# Patient Record
Sex: Male | Born: 1965 | Hispanic: Yes | Marital: Married | Smoking: Never smoker
Health system: Southern US, Community
[De-identification: ages and names within clinical notes are randomized; demographics above are authoritative.]

## PROBLEM LIST (undated history)

## (undated) DIAGNOSIS — K6289 Other specified diseases of anus and rectum: Secondary | ICD-10-CM

## (undated) DIAGNOSIS — M25519 Pain in unspecified shoulder: Secondary | ICD-10-CM

## (undated) DIAGNOSIS — R7303 Prediabetes: Secondary | ICD-10-CM

## (undated) DIAGNOSIS — R51 Headache: Secondary | ICD-10-CM

## (undated) DIAGNOSIS — G8929 Other chronic pain: Secondary | ICD-10-CM

## (undated) DIAGNOSIS — R519 Headache, unspecified: Secondary | ICD-10-CM

## (undated) HISTORY — PX: SHOULDER SURGERY: SHX246

## (undated) HISTORY — DX: Pain in unspecified shoulder: M25.519

## (undated) HISTORY — DX: Other chronic pain: G89.29

## (undated) HISTORY — DX: Headache: R51

## (undated) HISTORY — DX: Headache, unspecified: R51.9

## (undated) HISTORY — DX: Other specified diseases of anus and rectum: K62.89

---

## 2012-11-02 DIAGNOSIS — K602 Anal fissure, unspecified: Secondary | ICD-10-CM | POA: Insufficient documentation

## 2013-02-03 ENCOUNTER — Emergency Department: Payer: Self-pay | Admitting: Emergency Medicine

## 2013-02-10 ENCOUNTER — Emergency Department: Payer: Self-pay | Admitting: Emergency Medicine

## 2013-03-18 ENCOUNTER — Inpatient Hospital Stay: Payer: Self-pay | Admitting: Psychiatry

## 2013-03-18 LAB — DRUG SCREEN, URINE

## 2013-03-18 LAB — COMPREHENSIVE METABOLIC PANEL WITH GFR
Albumin: 4.4 g/dL
Alkaline Phosphatase: 63 U/L
Anion Gap: 5 — ABNORMAL LOW
BUN: 16 mg/dL
Bilirubin,Total: 0.6 mg/dL
Calcium, Total: 9.6 mg/dL
Chloride: 102 mmol/L
Co2: 27 mmol/L
Creatinine: 1.22 mg/dL
EGFR (African American): >60
EGFR (Non-African Amer.): >60
Glucose: 222 mg/dL — ABNORMAL HIGH
Osmolality: 276
Potassium: 4.2 mmol/L
SGOT(AST): 27 U/L
SGPT (ALT): 49 U/L
Sodium: 134 mmol/L — ABNORMAL LOW
Total Protein: 8.8 g/dL — ABNORMAL HIGH

## 2013-03-18 LAB — CBC
HCT: 50.1 %
HGB: 17.3 g/dL
MCH: 29.5 pg
MCHC: 34.5 g/dL
MCV: 86 fL
Platelet: 229 x10 3/mm 3
RBC: 5.86 x10 6/mm 3
RDW: 13 %
WBC: 9.1 x10 3/mm 3

## 2013-03-18 LAB — URINALYSIS, COMPLETE
Glucose,UR: >=500 mg/dL (ref 0–75)
Ketone: NEGATIVE
Leukocyte Esterase: NEGATIVE
Ph: 7 (ref 4.5–8.0)
Protein: NEGATIVE
RBC,UR: 1 /HPF (ref 0–5)
Specific Gravity: 1.017 (ref 1.003–1.030)
WBC UR: NONE SEEN /HPF (ref 0–5)

## 2013-03-18 LAB — SALICYLATE LEVEL: Salicylates, Serum: <1.7 mg/dL

## 2013-03-18 LAB — ETHANOL
Ethanol %: <0.003 %
Ethanol: <3 mg/dL

## 2013-03-18 LAB — ACETAMINOPHEN LEVEL: Acetaminophen: <2 ug/mL

## 2013-03-19 LAB — HEMOGLOBIN A1C: Hemoglobin A1C: 5.5 % (ref 4.2–6.3)

## 2013-03-26 ENCOUNTER — Emergency Department: Payer: Self-pay | Admitting: Emergency Medicine

## 2013-03-26 LAB — COMPREHENSIVE METABOLIC PANEL
Albumin: 3.7 g/dL (ref 3.4–5.0)
BUN: 13 mg/dL (ref 7–18)
Bilirubin,Total: 0.4 mg/dL (ref 0.2–1.0)
Calcium, Total: 8.4 mg/dL — ABNORMAL LOW (ref 8.5–10.1)
Chloride: 106 mmol/L (ref 98–107)
Co2: 26 mmol/L (ref 21–32)
Creatinine: 1.14 mg/dL (ref 0.60–1.30)
EGFR (African American): >60
EGFR (Non-African Amer.): >60
Glucose: 166 mg/dL — ABNORMAL HIGH (ref 65–99)
Potassium: 3.3 mmol/L — ABNORMAL LOW (ref 3.5–5.1)
SGOT(AST): 12 U/L — ABNORMAL LOW (ref 15–37)
Sodium: 139 mmol/L (ref 136–145)
Total Protein: 6.9 g/dL (ref 6.4–8.2)

## 2013-03-26 LAB — T4, FREE: Free Thyroxine: 1.03 ng/dL (ref 0.76–1.46)

## 2013-03-26 LAB — CK TOTAL AND CKMB (NOT AT ARMC)
CK, Total: 44 U/L (ref 35–232)
CK-MB: 0.9 ng/mL (ref 0.5–3.6)

## 2013-03-26 LAB — CBC
MCHC: 34.7 g/dL (ref 32.0–36.0)
Platelet: 164 10*3/uL (ref 150–440)
RBC: 5.4 10*6/uL (ref 4.40–5.90)
RDW: 13.4 % (ref 11.5–14.5)
WBC: 7.6 10*3/uL (ref 3.8–10.6)

## 2013-03-26 LAB — TSH: Thyroid Stimulating Horm: 0.694 u[IU]/mL

## 2013-03-26 LAB — TROPONIN I: Troponin-I: <0.02 ng/mL

## 2013-04-27 ENCOUNTER — Ambulatory Visit: Payer: Self-pay | Admitting: Orthopedic Surgery

## 2013-05-03 ENCOUNTER — Emergency Department: Payer: Self-pay | Admitting: Emergency Medicine

## 2013-05-03 LAB — COMPREHENSIVE METABOLIC PANEL
ALBUMIN: 4.3 g/dL (ref 3.4–5.0)
ALK PHOS: 55 U/L
Anion Gap: 10 (ref 7–16)
BILIRUBIN TOTAL: 1.5 mg/dL — AB (ref 0.2–1.0)
BUN: 14 mg/dL (ref 7–18)
CALCIUM: 9.1 mg/dL (ref 8.5–10.1)
CHLORIDE: 100 mmol/L (ref 98–107)
CO2: 24 mmol/L (ref 21–32)
Creatinine: 1.01 mg/dL (ref 0.60–1.30)
EGFR (African American): >60
Glucose: 163 mg/dL — ABNORMAL HIGH (ref 65–99)
Osmolality: 272 (ref 275–301)
Potassium: 3 mmol/L — ABNORMAL LOW (ref 3.5–5.1)
SGOT(AST): 30 U/L (ref 15–37)
SGPT (ALT): 48 U/L (ref 12–78)
Sodium: 134 mmol/L — ABNORMAL LOW (ref 136–145)
Total Protein: 8.1 g/dL (ref 6.4–8.2)

## 2013-05-03 LAB — URINALYSIS, COMPLETE
BACTERIA: NONE SEEN
Bilirubin,UR: NEGATIVE
Blood: NEGATIVE
Glucose,UR: NEGATIVE mg/dL (ref 0–75)
Ketone: NEGATIVE
Leukocyte Esterase: NEGATIVE
NITRITE: NEGATIVE
PH: 6 (ref 4.5–8.0)
Protein: NEGATIVE
RBC, UR: NONE SEEN /HPF (ref 0–5)
SPECIFIC GRAVITY: 1.001 (ref 1.003–1.030)
SQUAMOUS EPITHELIAL: NONE SEEN
WBC UR: NONE SEEN /HPF (ref 0–5)

## 2013-05-03 LAB — CK TOTAL AND CKMB (NOT AT ARMC)
CK, Total: 202 U/L (ref 35–232)
CK-MB: 1.3 ng/mL (ref 0.5–3.6)

## 2013-05-03 LAB — DRUG SCREEN, URINE
AMPHETAMINES, UR SCREEN: NEGATIVE (ref ?–1000)
BARBITURATES, UR SCREEN: NEGATIVE (ref ?–200)
Benzodiazepine, Ur Scrn: NEGATIVE (ref ?–200)
COCAINE METABOLITE, UR ~~LOC~~: NEGATIVE (ref ?–300)
Cannabinoid 50 Ng, Ur ~~LOC~~: NEGATIVE (ref ?–50)
MDMA (ECSTASY) UR SCREEN: NEGATIVE (ref ?–500)
Methadone, Ur Screen: NEGATIVE (ref ?–300)
Opiate, Ur Screen: NEGATIVE (ref ?–300)
PHENCYCLIDINE (PCP) UR S: NEGATIVE (ref ?–25)
Tricyclic, Ur Screen: NEGATIVE (ref ?–1000)

## 2013-05-03 LAB — CBC
HCT: 49.9 % (ref 40.0–52.0)
HGB: 17.1 g/dL (ref 13.0–18.0)
MCH: 29.7 pg (ref 26.0–34.0)
MCHC: 34.4 g/dL (ref 32.0–36.0)
MCV: 87 fL (ref 80–100)
Platelet: 175 10*3/uL (ref 150–440)
RBC: 5.76 10*6/uL (ref 4.40–5.90)
RDW: 13.6 % (ref 11.5–14.5)
WBC: 7.3 10*3/uL (ref 3.8–10.6)

## 2013-05-03 LAB — ETHANOL

## 2013-05-03 LAB — TROPONIN I: Troponin-I: <0.02 ng/mL

## 2013-05-26 ENCOUNTER — Encounter (HOSPITAL_COMMUNITY): Payer: Self-pay | Admitting: Psychiatry

## 2013-05-26 ENCOUNTER — Ambulatory Visit (INDEPENDENT_AMBULATORY_CARE_PROVIDER_SITE_OTHER): Payer: No Typology Code available for payment source | Admitting: Psychiatry

## 2013-05-26 VITALS — BP 115/66 | HR 81 | Wt 203.2 lb

## 2013-05-26 DIAGNOSIS — F323 Major depressive disorder, single episode, severe with psychotic features: Secondary | ICD-10-CM

## 2013-05-26 DIAGNOSIS — F329 Major depressive disorder, single episode, unspecified: Secondary | ICD-10-CM

## 2013-05-26 NOTE — Progress Notes (Signed)
[facility] Initial Assessment Note  Glenn Reed [id number] 48 y.o.  05/26/2013 2:53 PM  Chief Complaint:  I was given injection on December 18 and I have nervous breakdown.  I'm taking medication.  History of Present Illness:  Patient is a 48 year old [place] Spanish speaking person who was referred from her primary care physician for the management of his psychiatric illness.  Patient was admitted at [facility] from December 18- December 27th, 2014.  Patient was admitted because of nervous breakdown.  The patient speaks Spanish.  Information was obtained from the translator and his daughter who speaks [place].  Apparently patient was given steroid injection on his right shoulder and soon after that he developed intense feeling of anxiety, nervousness, paranoia, restlessness, racing thoughts, suicidal thinking and believes people are talking about him.  He was admitted to the hospital same night.  He stayed for 10 days.  He was discharged on Risperdal 2 mg at bedtime, trazodone 100 mg at bedtime and Prozac 40 mg daily.  Upon discharge he did very well and his symptoms are improved and he stopped taking the Risperdal because he developed some side effects. He complained about dry mouth, palpitations, restlessness and blurry vision.  After stopping risperidone the side effects improved.  Patient still has blurry vision and dry mouth that she believed you to trazodone and Prozac.  He tried stopping these medication but he developed significant anxiety and depressive thinking which requires emergency room visit.  He was recommended to continue his medication.  Patient was discharged to followup with his primary care physician however her primary care physician does not feel comfortable and recommended to see psychiatrist.  Patient is sleeping 6-7 hours.  He still feels anxious nervous and gets a startle response.  He admitted some time irritability, crying spells and  racing thoughts but he denies any suicidal thinking and paranoid feeling.  His daughter is upset because before December 18 patient was doing very well and everything is started after giving injection of steroids.  Patient denies any previous history of psychiatric symptoms, inpatient treatment, agitation, paranoia, hallucination or any suicidal thinking.  Patient currently feeling better however he continued to endorse side effects of Prozac and trazodone which is dry mouth, blurry vision, feeling tired and lack of energy.  He is scared to drive on his own.  He is not going to work because he feels ready anxious.  His appetite is okay.  He denies any tremors or any shakes.  Patient does not drink or use any illegal substances.  He denies any history of sexual, verbal and emotional abuse.  He denies any nightmares or any flashback.  Patient has good support from his wife and his daughter.  Suicidal Ideation: No Plan Formed: No Patient has means to carry out plan: No  Homicidal Ideation: No Plan Formed: No Patient has means to carry out plan: No  Past Psychiatric History/Hospitalization(s) Patient was admitted to [facility] from December 18 to March 27, 2013.  He was admitted because of retrospect having paranoia, suicidal thinking, severe depression and anxiety symptoms.  He was given an injection of steroid into his shoulder same day.  He was given Risperdal, Prozac, trazodone however patient stopped taking Risperdal because of side effects.  Patient denies any other history of mania, psychosis, hallucination, paranoia or any suicidal attempt in the past.   Anxiety: Yes Bipolar Disorder: No Depression: Yes Mania: No Psychosis: Yes Schizophrenia: No Personality Disorder: No Hospitalization for psychiatric illness:  Yes History of Electroconvulsive Shock Therapy: No Prior Suicide Attempts: No  Medical History; Patient has headaches, obesity, shoulder surgery and right shoulder  pain.  Her primary care physician is Jerl Santos at [facility].  Traumatic brain injury: Patient denies any history of traumatic brain injury.  Family History; Patient denies any history of family psychiatric illness.  Education and Work History; The patient has sixth grade education in [place].  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  Psychosocial History; The patient was born and raised in [place].  He moved to the [place] in 1993.  He is married.  He has 2 daughter.  His family is very supportive.  Legal History; Patient denies any legal history.  History Of Abuse; Patient denies any history of abuse.  Substance Abuse History; Patient denies any history of alcohol or any illegal substance use.   Review of Systems: Psychiatric: Agitation: No Hallucination: No Depressed Mood: Yes Insomnia: Yes Hypersomnia: No Altered Concentration: No Feels Worthless: No Grandiose Ideas: No Belief In Special Powers: No New/Increased Substance Abuse: No Compulsions: No  Neurologic: Headache: Yes Seizure: No Paresthesias: No    Outpatient Encounter Prescriptions as of 05/26/2013  Medication Sig  . diclofenac sodium (VOLTAREN) 1 % GEL Apply 2 g topically 4 (four) times daily.  Marland Kitchen FLUoxetine (PROZAC) 40 MG capsule Take 40 mg by mouth daily.  . hydrocortisone (ANUSOL-HC) 2.5 % rectal cream Place 1 application rectally 2 (two) times daily.  . polyethylene glycol (MIRALAX / GLYCOLAX) packet Take 17 g by mouth daily.  . traZODone (DESYREL) 100 MG tablet Take 100 mg by mouth at bedtime.    No results found for this or any previous visit (from the past 2160 hour(s)).    Physical Exam: Constitutional:  BP 115/66  Pulse 81  Wt 203 lb 3.2 oz (92.171 kg)  Musculoskeletal: Strength & Muscle Tone: within normal limits Gait & Station: normal Patient leans: N/A  Mental Status Examination;  Patient is  casually dressed and fairly groomed.  He appears anxious but cooperative.  His speech is slow but logical.  He denies any auditory or visual hallucination.  He denies any active or passive suicidal thoughts or homicidal thoughts.  He described his mood as nervous and anxious and his affect appears to be constricted.  There were no delusions, paranoia or any up session.  His fund of knowledge appears to be fair.  There were no tremors or shakes.  There is language barrior as patient speaks only [place].  His attention and concentration is fair.  He is alert and oriented x3.  His insight judgment and impulse control is okay.   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 Headache   . Shoulder pain   . Rectal pain, chronic     Axis IV: Mild to moderate   Plan:  I review his symptoms, collateral information, his current medication and psychosocial stressors.  At this time patient is comfortable taking Prozac 40 mg and trazodone 100 mg.  He had tried coming off from the medication but  he developed significant anxiety and depression.  He is not taking Risperdal.  We will get records from [facility] and from his primary care physician.  We do not have discharge summary at this time.  Reassurance given.  Discuss in detail the risks and benefits of medication.  I recommended to take trazodone 100 mg half to one tablet if needed and if patient continues to have dry mouth and blurry vision.  I will see him again in 4 weeks.Time spent 55 minutes.  More than 50% of the time spent in  psychoeducation, counseling and coordination of care.  Discuss safety plan that anytime having active suicidal thoughts or homicidal thoughts then patient need to call 911 or go to the local emergency room.    Dustyn Dansereau T., MD 05/26/2013

## 2013-06-24 ENCOUNTER — Ambulatory Visit (INDEPENDENT_AMBULATORY_CARE_PROVIDER_SITE_OTHER): Payer: No Typology Code available for payment source | Admitting: Psychiatry

## 2013-06-24 ENCOUNTER — Encounter (HOSPITAL_COMMUNITY): Payer: Self-pay | Admitting: *Deleted

## 2013-06-24 ENCOUNTER — Encounter (HOSPITAL_COMMUNITY): Payer: Self-pay | Admitting: Psychiatry

## 2013-06-24 VITALS — BP 119/70 | HR 72 | Ht 66.0 in | Wt 200.0 lb

## 2013-06-24 DIAGNOSIS — F323 Major depressive disorder, single episode, severe with psychotic features: Secondary | ICD-10-CM

## 2013-06-24 DIAGNOSIS — F329 Major depressive disorder, single episode, unspecified: Secondary | ICD-10-CM

## 2013-06-24 NOTE — Progress Notes (Signed)
[facility] [id number] Progress Note  Glenn Reed [id number] 48 y.o.  06/24/2013 3:31 PM  Chief Complaint:  I still feel anxious and nervous.  I still have shakes.    History of Present Illness:  Glenn Reed came for his followup appointment with his daughter who speaks bilingual.  Translator left because patient came 30 minutes late .  Her daughter translate the information.  Patient is doing better on trazodone and Prozac but he still has dizziness and nervousness.  He continues to have dry mouth , constipation and blurry vision which could be the side effects of trazodone.  Received discharge summary from [facility].  Patient was admitted because of severe psychosis and depression.  His UDS was negative.  His liver enzymes were normal.  His BUN was 14 and his creatinine was 1.01.  His CBC is normal.  His alcohol level is below the normal range.  Patient is sleeping 6-7 hours but he continues to have tremors and shakes and he has difficulty concentration.  Patient denies any recent crying spells, racing thoughts but admitted some time very anxious and nervous.  He has a paranoia or any hallucination.  Denies any suicidal thoughts.  He is concerned to these start his work because his attention and concentration is not good.  He does not drive by himself because of poor attention.  He is taking Prozac 40 mg and trazodone 100 mg.  Daughter denies any agitation anger or violence.  Patient does not drink or use any illegal substances.  His appetite is good. He is not taking any medication for his joint pain.  Patient developed a significant reaction to steroids that causes psychosis and require inpatient psychiatric services.  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  Suicidal Ideation: No Plan Formed: No Patient has means to carry out plan: No  Homicidal Ideation: No Plan Formed: No Patient has  means to carry out plan: No  Past Psychiatric History/Hospitalization(s) Patient was admitted to [facility] from December 18 to March 27, 2013.  He was admitted because of paranoia, suicidal thinking, severe depression and anxiety symptoms.  He was given an injection of steroid into his shoulder same day.  He was given Risperdal, Prozac, trazodone however patient stopped taking Risperdal because of side effects.  Patient denies any other history of mania, psychosis, hallucination, paranoia or any suicidal attempt in the past.   Anxiety: Yes Bipolar Disorder: No Depression: Yes Mania: No Psychosis: Yes Schizophrenia: No Personality Disorder: No Hospitalization for psychiatric illness: Yes History of Electroconvulsive Shock Therapy: No Prior Suicide Attempts: No  Medical History; Patient has headaches, obesity, shoulder surgery and right shoulder pain.  Her primary care physician is Jerl Santos at [facility].  Psychosocial History; The patient was born and raised in [place].  He moved to the [place] in 1993.  He is married.  He has 2 daughter.  His family is very supportive.  Review of Systems: Psychiatric: Agitation: No Hallucination: No Depressed Mood: No Insomnia: Yes Hypersomnia: No Altered Concentration: No Feels Worthless: No Grandiose Ideas: No Belief In Special Powers: No New/Increased Substance Abuse: No Compulsions: No  Neurologic: Headache: Yes Seizure: No Paresthesias: No    Outpatient Encounter Prescriptions as of 06/24/2013  Medication Sig  . FLUoxetine (PROZAC) 40 MG capsule Take 40 mg by mouth daily.  . traZODone (DESYREL) 100 MG tablet Take 100 mg by mouth at bedtime.  Marland Kitchen  diclofenac sodium (VOLTAREN) 1 % GEL Apply 2 g topically 4 (four) times daily.  . hydrocortisone (ANUSOL-HC) 2.5 % rectal cream Place 1 application rectally 2 (two) times daily.  . polyethylene glycol (MIRALAX / GLYCOLAX) packet Take 17 g by mouth  daily.    No results found for this or any previous visit (from the past 2160 hour(s)).    Physical Exam: Constitutional:  BP 119/70  Pulse 72  Ht 5\' 6"  (1.676 m)  Wt 200 lb (90.719 kg)  BMI 32.30 kg/m2  Musculoskeletal: Strength & Muscle Tone: within normal limits Gait & Station: normal Patient leans: N/A  Mental Status Examination;  Patient is casually dressed and fairly groomed.  He appears anxious but cooperative.  His speech is slow but logical.  He denies any auditory or visual hallucination.  He denies any active or passive suicidal thoughts or homicidal thoughts.  He described his mood anxious and his affect appears to be constricted.  He has some thought blocking however there were no delusions, paranoia or any up session.  His fund of knowledge appears to be fair.  He has mild shakes .  There is language barrior as patient speaks only [place].  His attention and concentration is fair.  He is alert and oriented x3.  His insight judgment and impulse control is okay.   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 Headache   . Shoulder pain   . Rectal pain, chronic     Axis IV: Mild to moderate   Plan:  I reviewed discharge summary from his previous hospitalization.  I reviewed the blood work and his current medication.  I do believe patient has side affects from trazodone resulting dry mouth, blurred vision and tremors.  Overall his depression has improved from the past.  I recommended to try trazodone half tablet at bedtime.  Patient and his daughter is  concerned about long-term side effects of medication.  I had a long discussion with the patient about prognosis and medication side effects.  In the future we will consider lowering the Prozac .  At this time patient does not able to go back to work.  He was require note to remain out of work until he will be seen in 6 weeks.  I recommended to call us back if he is any question of any concern.  Follow up in 6 weeks. .Time spent 25 minutes.  More than 50% of the time spent in psychoeducation, counseling and coordination of care.  Discuss safety plan that anytime having active suicidal thoughts or homicidal thoughts then patient need to call 911 or go to the local emergency room.    Chasitie Passey T., MD 06/24/2013

## 2013-08-05 ENCOUNTER — Ambulatory Visit (INDEPENDENT_AMBULATORY_CARE_PROVIDER_SITE_OTHER): Payer: No Typology Code available for payment source | Admitting: Psychiatry

## 2013-08-05 ENCOUNTER — Encounter (HOSPITAL_COMMUNITY): Payer: Self-pay | Admitting: Psychiatry

## 2013-08-05 VITALS — BP 117/71 | HR 75 | Ht 67.0 in | Wt 204.6 lb

## 2013-08-05 DIAGNOSIS — F329 Major depressive disorder, single episode, unspecified: Secondary | ICD-10-CM

## 2013-08-05 MED ORDER — FLUOXETINE HCL 10 MG PO CAPS: ORAL_CAPSULE | ORAL | Status: DC

## 2013-08-05 MED ORDER — TRAZODONE HCL 50 MG PO TABS: ORAL_TABLET | ORAL | Status: DC

## 2013-08-05 NOTE — Progress Notes (Signed)
[facility] [id number] Progress Note  Glenn PeruMargarito Reed [id number] 48 y.o.  08/05/2013 3:39 PM  Chief Complaint:  I am feeling better.    History of Present Illness:  Glenn Reed came for his followup appointment with his daughter who speaks bilingual and Nurse, learning disabilitytranslator.  Information was obtained from the translator.  The patient reported much improvement since we reduced the dose of trazodone .  He has less complain of dry mouth and blurry vision .  He is sleeping okay.  His thinking is much clearer and his daughter endorsed improvement in his energy level .  He denies any recent crying spells , irritability or any paranoia.  He continues to take Prozac 40 mg daily.  He has shoulder pain and he is waiting for the surgery .  Patient reported his workmen [employer] insurance has refused to pay for the surgery and he had applied for other insurance and he is waiting for the response.  Patient reported he has difficulty lifting his shoulder and he is seeing orthopedic [place].  He had a lawyer to help his case.  [facility] where he had last blood work.  His glucose was 106 and his creatinine was 0.94 and his BUN is 15.  These labs were done on March 5.  Patient is still off of work because of severe shoulder pain and depressive symptoms however his depressive symptoms are much improved from the past.  Patient denies any agitation, anger, mania, psychosis or any delusions.  His daughter endorsed that his father is coming back to his baseline.  Patient does not drink or use any legal substances.  Suicidal Ideation: No Plan Formed: No Patient has means to carry out plan: No  Homicidal Ideation: No Plan Formed: No Patient has means to carry out plan: No  Past Psychiatric History/Hospitalization(s) Patient was admitted to [facility] from December 18 to March 27, 2013.  He was admitted because of paranoia,  suicidal thinking, severe depression and anxiety symptoms.  He was given an injection of steroid into his shoulder same day.  He was given Risperdal, Prozac, trazodone however patient stopped taking Risperdal because of side effects.  Patient denies any other history of mania, psychosis, hallucination, paranoia or any suicidal attempt in the past.   Anxiety: Yes Bipolar Disorder: No Depression: Yes Mania: No Psychosis: Yes Schizophrenia: No Personality Disorder: No Hospitalization for psychiatric illness: Yes History of Electroconvulsive Shock Therapy: No Prior Suicide Attempts: No  Medical History; Patient has headaches, obesity, shoulder surgery and right shoulder pain.  Her primary care [facility].  Review of Systems: Psychiatric: Agitation: No Hallucination: No Depressed Mood: No Insomnia: No Hypersomnia: No Altered Concentration: No Feels Worthless: No Grandiose Ideas: No Belief In Special Powers: No New/Increased Substance Abuse: No Compulsions: No  Neurologic: Headache: Yes Seizure: No Paresthesias: No    Outpatient Encounter Prescriptions as of 08/05/2013  Medication Sig  . diclofenac sodium (VOLTAREN) 1 % GEL Apply 2 g topically 4 (four) times daily.  Marland Kitchen. FLUoxetine (PROZAC) 10 MG capsule Take 3 capsule daily  . hydrocortisone (ANUSOL-HC) 2.5 % rectal cream Place 1 application rectally 2 (two) times daily.  . polyethylene glycol (MIRALAX / GLYCOLAX) packet Take 17 g by mouth daily.  . traZODone (DESYREL) 50 MG tablet Take 1/2 to 1 tab at bed time  . [DISCONTINUED] FLUoxetine (PROZAC) 40 MG capsule Take 40 mg by mouth daily.  . [  DISCONTINUED] traZODone (DESYREL) 100 MG tablet Take 100 mg by mouth at bedtime.    No results found for this or any previous visit (from the past 2160 hour(s)).    Physical Exam: Constitutional:  BP 117/71  Pulse 75  Ht 5\' 7"  (1.702 m)  Wt 204 lb 9.6 oz (92.806 kg)  BMI 32.04  kg/m2  Musculoskeletal: Strength & Muscle Tone: within normal limits Gait & Station: normal Patient leans: N/A  Mental Status Examination;  Patient is casually dressed and fairly groomed.  He is cooperative and maintained good eye contact.  His speech is slow but logical.  He denies any auditory or visual hallucination.  He denies any active or passive suicidal thoughts or homicidal thoughts.  He described his mood ok and his affect is improved from the past.  His thought processes logical and goal-directed.  There were no delusions, paranoia or any obsessive thoughts .  His fund of knowledge appears to be fair.  There is language barrior as patient speaks only [place].  His attention and concentration is fair.  He is alert and oriented x3.  His insight judgment and impulse control is okay.   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 Headache   . Shoulder pain   . Rectal pain, chronic     Axis IV: Mild to moderate   Plan:  Patient is doing better from the past since the dose of the trazodone has been reduced.  I review the records from his primary care physician including blood work.  Patient and his daughter has a lot of question about his long-term prognosis and need of any psychotropic medications.  I recommended to reduce trazodone 25 mg and Prozac 30 mg to try however if the symptoms get worse then he can cause back and go back to 40 mg Prozac.  We will continue to try lowering the medication since patient and his daughter believe his illness started after the steroid  injection which was given in December.  Patient remains out of work but I explained he could go back to work since his depression is getting better.  Patient will consult his orthopedic surgeon about his disability and out of work status  I recommended to call us back if he is any question of any concern.  Follow up in 8 weeks. .Time spent 25 minutes.  More than 50% of the time spent in psychoeducation, counseling and coordination of care.  Discuss safety plan that anytime having active suicidal thoughts or homicidal thoughts then patient need to call 911 or go to the local emergency room.    Demir Titsworth T., MD 08/05/2013

## 2013-08-06 ENCOUNTER — Telehealth (HOSPITAL_COMMUNITY): Payer: Self-pay

## 2013-08-12 ENCOUNTER — Telehealth (HOSPITAL_COMMUNITY): Payer: Self-pay

## 2013-10-05 ENCOUNTER — Encounter (HOSPITAL_COMMUNITY): Payer: Self-pay | Admitting: Psychiatry

## 2013-10-05 ENCOUNTER — Ambulatory Visit (INDEPENDENT_AMBULATORY_CARE_PROVIDER_SITE_OTHER): Payer: No Typology Code available for payment source | Admitting: Psychiatry

## 2013-10-05 VITALS — BP 130/72 | HR 69 | Ht 67.0 in | Wt 204.0 lb

## 2013-10-05 DIAGNOSIS — F323 Major depressive disorder, single episode, severe with psychotic features: Secondary | ICD-10-CM

## 2013-10-05 DIAGNOSIS — F320 Major depressive disorder, single episode, mild: Secondary | ICD-10-CM

## 2013-10-05 MED ORDER — TRAZODONE HCL 50 MG PO TABS: ORAL_TABLET | ORAL | Status: DC

## 2013-10-05 MED ORDER — FLUOXETINE HCL 10 MG PO CAPS: ORAL_CAPSULE | ORAL | Status: DC

## 2013-10-05 NOTE — Progress Notes (Signed)
[facility] [id number] Progress Note  Glenn Reed [id number] 48 y.o.  10/05/2013 11:21 AM  Chief Complaint:  Medication management and followup.   History of Present Illness:  Glenn Reed came for his followup appointment with his daughter. Information was obtained from the translator.  On his last visit we did use trazodone 25 mg at bedtime and Prozac 30 mg daily.  The patient is tolerating his medications without any side effects.  He still has some anxiety and nervousness but overall his mood has been improved .  He is still like to come off from his psychotropic medication.  The patient was disappointed because he was fired from his work when he reported to his jaw.  He is actively looking for a new job.  Patient denies any irritability, anger, mood swings or any crying spells.  His energy level is good.  His appetite is okay.  He continues to have on and off shoulder pain but he is not taking any pain medication on a regular basis.  Patient denies any hallucination or any paranoia.  He is sleeping good with half tablet of trazodone. Patient does not drink or use any legal substances.  His vitals are stable.  His gait is unchanged from the past.  Suicidal Ideation: No Plan Formed: No Patient has means to carry out plan: No  Homicidal Ideation: No Plan Formed: No Patient has means to carry out plan: No  Past Psychiatric History/Hospitalization(s) Patient was admitted to [facility] from December 18 to March 27, 2013.  He was admitted because of paranoia, suicidal thinking, severe depression and anxiety symptoms.  He was given an injection of steroid into his shoulder same day.  He was given Risperdal, Prozac, trazodone however patient stopped taking Risperdal because of side effects.  Patient denies any other history of mania, psychosis, hallucination, paranoia or any suicidal attempt in the past.   Anxiety: Yes Bipolar Disorder: No Depression: Yes Mania:  No Psychosis: Yes Schizophrenia: No Personality Disorder: No Hospitalization for psychiatric illness: Yes History of Electroconvulsive Shock Therapy: No Prior Suicide Attempts: No  Medical History; Patient has headaches, obesity, shoulder surgery and right shoulder pain.  Her primary care physician is Jerl [facility]nn Campbell at [facility].  Review of Systems: Psychiatric: Agitation: No Hallucination: No Depressed Mood: No Insomnia: No Hypersomnia: No Altered Concentration: No Feels Worthless: No Grandiose Ideas: No Belief In Special Powers: No New/Increased Substance Abuse: No Compulsions: No  Neurologic: Headache: Yes Seizure: No Paresthesias: No    Outpatient Encounter Prescriptions as of 10/05/2013  Medication Sig  . diclofenac sodium (VOLTAREN) 1 % GEL Apply 2 g topically 4 (four) times daily.  Marland Kitchen. FLUoxetine (PROZAC) 10 MG capsule Take 2 capsule daily for 1 month and 1 capsule daily  . hydrocortisone (ANUSOL-HC) 2.5 % rectal cream Place 1 application rectally 2 (two) times daily.  . polyethylene glycol (MIRALAX / GLYCOLAX) packet Take 17 g by mouth daily.  . traZODone (DESYREL) 50 MG tablet Take 1/2 to 1 tab at bed time  . [DISCONTINUED] FLUoxetine (PROZAC) 10 MG capsule Take 3 capsule daily  . [DISCONTINUED] traZODone (DESYREL) 50 MG tablet Take 1/2 to 1 tab at bed time    No results found for this or any previous visit (from the past 2160 hour(s)).    Physical Exam: Constitutional:  BP 130/72  Pulse 69  Ht 5\' 7"  (1.702 m)  Wt 204 lb (92.534 kg)  BMI 31.94 kg/m2  Musculoskeletal: Strength & Muscle Tone: within normal  limits Gait & Station: normal Patient leans: N/A  Mental Status Examination;  Patient is casually dressed and fairly groomed.  He is cooperative and maintained good eye contact.  His speech is slow but logical.  He denies any auditory or visual hallucination.  He denies any active or passive suicidal thoughts or homicidal  thoughts.  He described his mood ok and his affect is improved from the past.  His thought processes logical and goal-directed.  There were no delusions, paranoia or any obsessive thoughts .  His fund of knowledge appears to be fair.  There is language barrior as patient speaks only [place].  His attention and concentration is fair.  He is alert and oriented x3.  His insight judgment and impulse control is okay.   Established Problem, Stable/Improving (1), Review of Psycho-Social Stressors (1), Review of Last Therapy Session (1), Review of Medication Regimen & Side Effects (2) and Review of New Medication or Change in Dosage (2)  Assessment: Axis I: Maj. depressive disorder with psychotic features, rule out mood disorder due to general medical condition  Axis II: Deferred  Axis III:  Past Medical History  Diagnosis Date  . Headache   . Shoulder pain   . Rectal pain, chronic     Axis IV: Mild to moderate   Plan:  Patient is doing better on low dose trazodone and 30 mg of Prozac.  He wants to come off from Prozac.  I produced the dose to 20 mg for 1 month and then 10 mg only for one month.  I recommended to continue trazodone 25 mg at bedtime for insomnia.  Patient is anxious because he is currently not working and actively looking for a new job.  Discuss the risks and benefits of medication.  I recommended to call us back if he feels symptoms of depression getting worse.  We will consider taking him off completely from Prozac on his next visit.  I'll see him again in 2 months.  Time spent 25 minutes.  More than 50% of the time spent in psychoeducation, counseling and coordination of care.  Discuss safety plan that anytime having active suicidal thoughts or homicidal thoughts then patient need to call 911 or go to the local emergency room.    ARFEEN,SYED T., MD 10/05/2013

## 2013-12-08 ENCOUNTER — Ambulatory Visit (INDEPENDENT_AMBULATORY_CARE_PROVIDER_SITE_OTHER): Payer: No Typology Code available for payment source | Admitting: Psychiatry

## 2013-12-08 ENCOUNTER — Encounter (HOSPITAL_COMMUNITY): Payer: Self-pay | Admitting: Psychiatry

## 2013-12-08 VITALS — BP 125/76 | HR 73 | Ht 67.0 in | Wt 208.6 lb

## 2013-12-08 DIAGNOSIS — F323 Major depressive disorder, single episode, severe with psychotic features: Secondary | ICD-10-CM

## 2013-12-08 DIAGNOSIS — F320 Major depressive disorder, single episode, mild: Secondary | ICD-10-CM

## 2013-12-08 NOTE — Progress Notes (Signed)
[facility] [id number] Progress Note  Glenn Reed [id number] 48 y.o.  12/08/2013 3:17 PM  Chief Complaint:  Medication management and followup.   History of Present Illness:  Glenn Reed came for his followup appointment . Information was obtained from the translator.  On his last visit we reduced trazodone 25 mg half tablet at bedtime and Prozac from 20 mg to 10 mg daily.  Patient wants to come off from the medication.  He is doing much better despite he was upset because his brother got stroke 15 days ago.  His broher lives in [place].  He denies any depressive thoughts.  Denies any crying spells.  He is sleeping good and he wants to come off from trazodone and Prozac.  He has shoulder pain and some time he has difficulty lifting his arm.  He is unable to get job and he is still looking for it.  Patient denies any paranoia, hallucination, agitation or any crying spells.  His appetite is okay.  His energy level is good.  His appetite is okay.  His vitals are stable.    Suicidal Ideation: No Plan Formed: No Patient has means to carry out plan: No  Homicidal Ideation: No Plan Formed: No Patient has means to carry out plan: No  Past Psychiatric History/Hospitalization(s) Patient was admitted to [facility] from December 18 to March 27, 2013.  He was admitted because of paranoia, suicidal thinking, severe depression and anxiety symptoms.  He was given an injection of steroid into his shoulder same day.  He was given Risperdal, Prozac, trazodone however patient stopped taking Risperdal because of side effects.  Patient denies any other history of mania, psychosis, hallucination, paranoia or any suicidal attempt in the past.   Anxiety: Yes Bipolar Disorder: No Depression: Yes Mania: No Psychosis: Yes Schizophrenia: No Personality Disorder: No Hospitalization for psychiatric illness: Yes History of Electroconvulsive Shock Therapy: No Prior Suicide Attempts:  No  Medical History; Patient has headaches, obesity, shoulder surgery and right shoulder pain.  Her primary care physician is Jerl Santos at [facility].  Review of Systems: Psychiatric: Agitation: No Hallucination: No Depressed Mood: No Insomnia: No Hypersomnia: No Altered Concentration: No Feels Worthless: No Grandiose Ideas: No Belief In Special Powers: No New/Increased Substance Abuse: No Compulsions: No  Neurologic: Headache: Yes Seizure: No Paresthesias: No    Outpatient Encounter Prescriptions as of 12/08/2013  Medication Sig  . diclofenac sodium (VOLTAREN) 1 % GEL Apply 2 g topically 4 (four) times daily.  Marland Kitchen FLUoxetine (PROZAC) 10 MG capsule Take 2 capsule daily for 1 month and 1 capsule daily  . hydrocortisone (ANUSOL-HC) 2.5 % rectal cream Place 1 application rectally 2 (two) times daily.  . polyethylene glycol (MIRALAX / GLYCOLAX) packet Take 17 g by mouth daily.  . [DISCONTINUED] traZODone (DESYREL) 50 MG tablet Take 1/2 to 1 tab at bed time    No results found for this or any previous visit (from the past 2160 hour(s)).    Physical Exam: Constitutional:  BP 125/76  Pulse 73  Ht  (1.702 m)  Wt 208 lb 9.6 oz (94.62 kg)  BMI 32.66 kg/m2  Musculoskeletal: Strength & Muscle Tone: within normal limits Gait & Station: normal Patient leans: N/A  Mental Status Examination;  Patient is casually dressed and fairly groomed.  He is cooperative and maintained good eye contact.  His speech is slow but logical.  He denies any auditory or visual hallucination.  He denies any active or passive suicidal  thoughts or homicidal thoughts.  He described his mood ok and his affect is improved from the past.  His thought process is logical and goal-directed.  There were no delusions, paranoia or any obsessive thoughts .  His fund of knowledge appears to be fair.  There is a language barrior as patient speaks only [place].  His attention and  concentration is fair.  He is alert and oriented x3.  His insight judgment and impulse control is okay.   Established Problem, Stable/Improving (1), Review of Psycho-Social Stressors (1), Review of Last Therapy Session (1), Review of Medication Regimen & Side Effects (2) and Review of New Medication or Change in Dosage (2)  Assessment: Axis I: Maj. depressive disorder with psychotic features, rule out mood disorder due to general medical condition  Axis II: Deferred  Axis III:  Past Medical History  Diagnosis Date  . Headache   . Shoulder pain   . Rectal pain, chronic     Axis IV: Mild to moderate   Plan:  Patient wants to come off all his medication.  Recommended to discontinue trazodone and continue Prozac 10 mg for 2 weeks and then stopped.  Recommended to call us back if he feels worsening of the symptoms are and he has any further questions.  I will see him again in 3 months unless he wants to see me earlier.     Ellwyn Ergle T., MD 12/08/2013

## 2014-03-09 ENCOUNTER — Encounter (HOSPITAL_COMMUNITY): Payer: Self-pay | Admitting: Psychiatry

## 2014-03-09 ENCOUNTER — Ambulatory Visit (INDEPENDENT_AMBULATORY_CARE_PROVIDER_SITE_OTHER): Payer: No Typology Code available for payment source | Admitting: Psychiatry

## 2014-03-09 VITALS — BP 124/76 | HR 71 | Ht 67.0 in | Wt 208.4 lb

## 2014-03-09 DIAGNOSIS — F323 Major depressive disorder, single episode, severe with psychotic features: Secondary | ICD-10-CM

## 2014-03-09 DIAGNOSIS — F320 Major depressive disorder, single episode, mild: Secondary | ICD-10-CM

## 2014-03-09 NOTE — Progress Notes (Signed)
[facility] [id number] Progress Note  Glenn PeruMargarito Vasquez [id number] 48 y.o.  03/09/2014 3:20 PM  Chief Complaint:  Medication management and followup.   History of Present Illness:  Glenn Reed came for his followup appointment and he was seen with interpreter.  On his last visit he wanted to come off from Prozac and he was recommended to discontinue after taper down the Prozac.  He has some time anxiety and nervousness but overall he described his mood has been stable and better.  He does not want to go back on medication.  He sleeping good.  He has applied for disability because of shoulder pain.  He denies any agitation, crying spells, anger or any mood swings.  He had a good family support.  He had a good Thanksgiving.  His appetite is okay.  His vitals are stable.  Suicidal Ideation: No Plan Formed: No Patient has means to carry out plan: No  Homicidal Ideation: No Plan Formed: No Patient has means to carry out plan: No  Past Psychiatric History/Hospitalization(s) Patient was admitted to [facility] in December 2014.  He was admitted because of paranoia, suicidal thinking, severe depression and anxiety symptoms.  He was given an injection of steroid into his shoulder same day.  He was given Risperdal, Prozac, trazodone however patient stopped taking Risperdal because of side effects.  Patient denies any other history of mania, psychosis, hallucination, paranoia or any suicidal attempt in the past.   Anxiety: Yes Bipolar Disorder: No Depression: Yes Mania: No Psychosis: Yes Schizophrenia: No Personality Disorder: No Hospitalization for psychiatric illness: Yes History of Electroconvulsive Shock Therapy: No Prior Suicide Attempts: No  Medical History; Patient has headaches, obesity, shoulder surgery and right shoulder pain.  Her primary care physician is Jerl [facility]nn Campbell at [facility].  Review of Systems: Psychiatric: Agitation:  No Hallucination: No Depressed Mood: No Insomnia: No Hypersomnia: No Altered Concentration: No Feels Worthless: No Grandiose Ideas: No Belief In Special Powers: No New/Increased Substance Abuse: No Compulsions: No  Neurologic: Headache: No Seizure: No Paresthesias: No    Outpatient Encounter Prescriptions as of 03/09/2014  Medication Sig  . diclofenac sodium (VOLTAREN) 1 % GEL Apply 2 g topically 4 (four) times daily.  . hydrocortisone (ANUSOL-HC) 2.5 % rectal cream Place 1 application rectally 2 (two) times daily.  . polyethylene glycol (MIRALAX / GLYCOLAX) packet Take 17 g by mouth daily.  . [DISCONTINUED] FLUoxetine (PROZAC) 10 MG capsule Take 2 capsule daily for 1 month and 1 capsule daily (Patient not taking: Reported on 03/09/2014)    No results found for this or any previous visit (from the past 2160 hour(s)).    Physical Exam: Constitutional:  BP 124/76 mmHg  Pulse 71  Ht 5\' 7"  (1.702 m)  Wt 208 lb 6.4 oz (94.53 kg)  BMI 32.63 kg/m2  Musculoskeletal: Strength & Muscle Tone: within normal limits Gait & Station: normal Patient leans: N/A  Mental Status Examination;  Patient is casually dressed and fairly groomed.  He is pleasant and cooperative.  He maintained good eye contact.  His speech is slow but logical.  He denies any auditory or visual hallucination.  He denies any active or passive suicidal thoughts or homicidal thoughts.  He described his mood ok and his affect is improved from the past.  His thought process is logical and goal-directed.  There were no delusions, paranoia or any obsessive thoughts .  His fund of knowledge appears to be fair.  There is a language barrior as  patient speaks only [place].  His attention and concentration is fair.  He is alert and oriented x3.  His insight judgment and impulse control is okay.   Established Problem, Stable/Improving (1) and Review of Last Therapy Session (1)  Assessment: Axis I: Maj. depressive disorder  with psychotic features, rule out mood disorder due to general medical condition  Axis II: Deferred  Axis III:  Past Medical History  Diagnosis Date  . Headache   . Shoulder pain   . Rectal pain, chronic      Plan:  Patient is not taking Prozac for a while and he has some anxiety but does not want to go back on medication.  Recommended to call us back if symptoms started to get worse.  At this time he does not want to schedule appointment however if he feels symptoms are getting worse then he will call us back.  No new medication given today.  Shadana Pry T., MD 03/09/2014

## 2014-04-17 ENCOUNTER — Emergency Department: Payer: Self-pay | Admitting: Emergency Medicine

## 2014-04-17 LAB — CBC WITH DIFFERENTIAL/PLATELET
Basophil #: 0.4 10*3/uL — ABNORMAL HIGH (ref 0.0–0.1)
Basophil %: 4.9 %
EOS ABS: 0.1 10*3/uL (ref 0.0–0.7)
EOS PCT: 1.9 %
HCT: 49.7 % (ref 40.0–52.0)
HGB: 16.9 g/dL (ref 13.0–18.0)
LYMPHS PCT: 22.1 %
Lymphocyte #: 1.7 10*3/uL (ref 1.0–3.6)
MCH: 30 pg (ref 26.0–34.0)
MCHC: 34 g/dL (ref 32.0–36.0)
MCV: 88 fL (ref 80–100)
Monocyte #: 0.5 x10 3/mm (ref 0.2–1.0)
Monocyte %: 6.6 %
Neutrophil #: 5 10*3/uL (ref 1.4–6.5)
Neutrophil %: 64.5 %
Platelet: 175 10*3/uL (ref 150–440)
RBC: 5.63 10*6/uL (ref 4.40–5.90)
RDW: 13.3 % (ref 11.5–14.5)
WBC: 7.8 10*3/uL (ref 3.8–10.6)

## 2014-04-17 LAB — URINALYSIS, COMPLETE
BILIRUBIN, UR: NEGATIVE
Bacteria: NONE SEEN
Blood: NEGATIVE
GLUCOSE, UR: NEGATIVE mg/dL (ref 0–75)
KETONE: NEGATIVE
Leukocyte Esterase: NEGATIVE
Nitrite: NEGATIVE
Ph: 6 (ref 4.5–8.0)
Protein: NEGATIVE
Specific Gravity: 1.024 (ref 1.003–1.030)

## 2014-04-17 LAB — COMPREHENSIVE METABOLIC PANEL
ALBUMIN: 4.1 g/dL (ref 3.4–5.0)
ALK PHOS: 57 U/L
ANION GAP: 10 (ref 7–16)
AST: 43 U/L — AB (ref 15–37)
BUN: 18 mg/dL (ref 7–18)
Bilirubin,Total: 0.5 mg/dL (ref 0.2–1.0)
Calcium, Total: 8.7 mg/dL (ref 8.5–10.1)
Chloride: 101 mmol/L (ref 98–107)
Co2: 28 mmol/L (ref 21–32)
Creatinine: 0.98 mg/dL (ref 0.60–1.30)
EGFR (African American): >60
EGFR (Non-African Amer.): >60
Glucose: 132 mg/dL — ABNORMAL HIGH (ref 65–99)
Osmolality: 281 (ref 275–301)
POTASSIUM: 3.7 mmol/L (ref 3.5–5.1)
SGPT (ALT): 133 U/L — ABNORMAL HIGH
Sodium: 139 mmol/L (ref 136–145)
TOTAL PROTEIN: 7.8 g/dL (ref 6.4–8.2)

## 2014-04-17 LAB — LIPASE, BLOOD: LIPASE: 183 U/L (ref 73–393)
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
## 2015-10-15 ENCOUNTER — Emergency Department
Admission: EM | Admit: 2015-10-15 | Discharge: 2015-10-15 | Disposition: A | Discharge status: Home / Self Care | Payer: BLUE CROSS/BLUE SHIELD | Attending: Emergency Medicine | Admitting: Emergency Medicine

## 2015-10-15 ENCOUNTER — Encounter: Payer: Self-pay | Admitting: Emergency Medicine

## 2015-10-15 DIAGNOSIS — K529 Noninfective gastroenteritis and colitis, unspecified: Secondary | ICD-10-CM | POA: Insufficient documentation

## 2015-10-15 DIAGNOSIS — R1013 Epigastric pain: Secondary | ICD-10-CM | POA: Diagnosis present

## 2015-10-15 HISTORY — DX: Prediabetes: R73.03

## 2015-10-15 LAB — URINALYSIS COMPLETE WITH MICROSCOPIC (ARMC ONLY)
BILIRUBIN URINE: NEGATIVE
Bacteria, UA: NONE SEEN
GLUCOSE, UA: NEGATIVE mg/dL
HGB URINE DIPSTICK: NEGATIVE
KETONES UR: NEGATIVE mg/dL
LEUKOCYTES UA: NEGATIVE
NITRITE: NEGATIVE
Protein, ur: NEGATIVE mg/dL
RBC / HPF: NONE SEEN RBC/hpf (ref 0–5)
SPECIFIC GRAVITY, URINE: 1.019 (ref 1.005–1.030)
Squamous Epithelial / LPF: NONE SEEN
pH: 6 (ref 5.0–8.0)

## 2015-10-15 LAB — COMPREHENSIVE METABOLIC PANEL
ALT: 31 U/L (ref 17–63)
ANION GAP: 8 (ref 5–15)
AST: 23 U/L (ref 15–41)
Albumin: 4.5 g/dL (ref 3.5–5.0)
Alkaline Phosphatase: 49 U/L (ref 38–126)
BUN: 18 mg/dL (ref 6–20)
CHLORIDE: 103 mmol/L (ref 101–111)
CO2: 29 mmol/L (ref 22–32)
CREATININE: 0.98 mg/dL (ref 0.61–1.24)
Calcium: 8.7 mg/dL — ABNORMAL LOW (ref 8.9–10.3)
Glucose, Bld: 110 mg/dL — ABNORMAL HIGH (ref 65–99)
Potassium: 4 mmol/L (ref 3.5–5.1)
Sodium: 140 mmol/L (ref 135–145)
Total Bilirubin: 0.6 mg/dL (ref 0.3–1.2)
Total Protein: 7.3 g/dL (ref 6.5–8.1)

## 2015-10-15 LAB — CBC
HCT: 46.8 % (ref 40.0–52.0)
HEMOGLOBIN: 16.5 g/dL (ref 13.0–18.0)
MCH: 30.5 pg (ref 26.0–34.0)
MCHC: 35.2 g/dL (ref 32.0–36.0)
MCV: 86.7 fL (ref 80.0–100.0)
Platelets: 157 10*3/uL (ref 150–440)
RBC: 5.41 MIL/uL (ref 4.40–5.90)
RDW: 13.3 % (ref 11.5–14.5)
WBC: 10.6 10*3/uL (ref 3.8–10.6)

## 2015-10-15 LAB — LIPASE, BLOOD: LIPASE: 32 U/L (ref 11–51)

## 2015-10-15 NOTE — ED Notes (Addendum)
Interpreter paged; Pt c/o epigastric pain since about 9pm last night; says the pain is similar to when he has to have a bowel movement but not constipated; pt says he had company over tonight-had greasy food and cake; denies N/V;

## 2015-10-15 NOTE — ED Provider Notes (Addendum)
[facility] Emergency Department Provider Note  ____________________________________________  Time seen: 4:00 AM  I have reviewed the triage vital signs and the nursing notes.   HISTORY  Chief Complaint Abdominal Pain      HPI Glenn Reed is a 50 y.o. male presents with acute onset of epigastric abdominal pain at 9 PM accompanied by chills. Patient denies any nausea no vomiting. Patient states pain completely resolved after having "really watery stools". Of note patient's last food ingestion was at 7 PM. Patient has no pain at this time no nausea no vomiting.     Past Medical History  Diagnosis Date  . Headache   . Shoulder pain   . Rectal pain, chronic   . Pre-diabetes     There are no active problems to display for this patient.   Past Surgical History  Procedure Laterality Date  . Shoulder surgery      Current Outpatient Rx  Name  Route  Sig  Dispense  Refill  . diclofenac sodium (VOLTAREN) 1 % GEL   Topical   Apply 2 g topically 4 (four) times daily.         . hydrocortisone (ANUSOL-HC) 2.5 % rectal cream   Rectal   Place 1 application rectally 2 (two) times daily.         . polyethylene glycol (MIRALAX / GLYCOLAX) packet   Oral   Take 17 g by mouth daily.           Allergies Prednisone  History reviewed. No pertinent family history.  Social History Social History  Substance Use Topics  . Smoking status: Never Smoker   . Smokeless tobacco: None  . Alcohol Use: No    Review of Systems  Constitutional: Negative for fever. Eyes: Negative for visual changes. ENT: Negative for sore throat. Cardiovascular: Negative for chest pain. Respiratory: Negative for shortness of breath. Gastrointestinal: Oz of her abdominal pain and diarrhea Genitourinary: Negative for dysuria. Musculoskeletal: Negative for back pain. Skin: Negative for rash. Neurological: Negative for headaches, focal weakness or  numbness.  10-point ROS otherwise negative.  ____________________________________________   PHYSICAL EXAM:  VITAL SIGNS: ED Triage Vitals  Enc Vitals Group     BP 10/15/15 0133 133/85 mmHg     Pulse Rate 10/15/15 0133 103     Resp 10/15/15 0133 18     Temp 10/15/15 0133 98 F (36.7 C)     Temp Source 10/15/15 0133 Oral     SpO2 10/15/15 0133 98 %     Weight 10/15/15 0133 190 lb (86.183 kg)     Height 10/15/15 0133 5\' 7"  (1.702 m)     Head Cir --      Peak Flow --      Pain Score 10/15/15 0134 1     Pain Loc --      Pain Edu? --      Excl. in GC? --      Constitutional: Alert and oriented. Well appearing and in no distress. Eyes: Conjunctivae are normal. PERRL. Normal extraocular movements. ENT   Head: Normocephalic and atraumatic.   Nose: No congestion/rhinnorhea.   Mouth/Throat: Mucous membranes are moist.   Neck: No stridor. Hematological/Lymphatic/Immunilogical: No cervical lymphadenopathy. Cardiovascular: Normal rate, regular rhythm. Normal and symmetric distal pulses are present in all extremities. No murmurs, rubs, or gallops. Respiratory: Normal respiratory effort without tachypnea nor retractions. Breath sounds are clear and equal bilaterally. No wheezes/rales/rhonchi. Gastrointestinal: Soft and nontender. No distention. There is no CVA tenderness.  Genitourinary: deferred Musculoskeletal: Nontender with normal range of motion in all extremities. No joint effusions.  No lower extremity tenderness nor edema. Neurologic:  Normal speech and language. No gross focal neurologic deficits are appreciated. Speech is normal.  Skin:  Skin is warm, dry and intact. No rash noted. Psychiatric: Mood and affect are normal. Speech and behavior are normal. Patient exhibits appropriate insight and judgment.  ____________________________________________    LABS (pertinent positives/negatives)  Labs Reviewed  COMPREHENSIVE METABOLIC PANEL - Abnormal; Notable for  the following:    Glucose, Bld 110 (*)    Calcium 8.7 (*)    All other components within normal limits  URINALYSIS COMPLETEWITH MICROSCOPIC ([facility] ONLY) - Abnormal; Notable for the following:    Color, Urine YELLOW (*)    APPearance CLEAR (*)    All other components within normal limits  LIPASE, BLOOD  CBC   ED ECG REPORT I, [place] N Aurianna Earlywine, the attending physician, personally viewed and interpreted this ECG.   Date: 10/15/2015  EKG Time: 1:34 AM  Rate: 97  Rhythm: Normal sinus rhythm  Axis: Normal  Intervals: Normal  ST&T Change: None   Procedures     INITIAL IMPRESSION / ASSESSMENT AND PLAN / ED COURSE  Pertinent labs & imaging results that were available during my care of the patient were reviewed by me and considered in my medical decision making (see chart for details).  Laboratory data unremarkable no abdominal pain on exam patient denies any abdominal pain at present. As such no imaging of the abdomen performed history and physical exam consistent with possible gastroenteritis.  ____________________________________________   FINAL CLINICAL IMPRESSION(S) / ED DIAGNOSES  Final diagnoses:  Gastroenteritis      Darci Current, MD 10/15/15 0422  Darci Current, MD 10/15/15 [phone]

## 2015-10-15 NOTE — ED Notes (Signed)
Assessment completed with help of rafael, interpreter

## 2016-07-10 ENCOUNTER — Encounter: Payer: Self-pay | Admitting: Emergency Medicine

## 2016-07-10 DIAGNOSIS — K859 Acute pancreatitis without necrosis or infection, unspecified: Secondary | ICD-10-CM | POA: Diagnosis not present

## 2016-07-10 DIAGNOSIS — R1013 Epigastric pain: Secondary | ICD-10-CM | POA: Diagnosis present

## 2016-07-10 DIAGNOSIS — Z79899 Other long term (current) drug therapy: Secondary | ICD-10-CM | POA: Insufficient documentation

## 2016-07-10 DIAGNOSIS — K2900 Acute gastritis without bleeding: Secondary | ICD-10-CM | POA: Diagnosis not present

## 2016-07-10 LAB — CBC
HEMATOCRIT: 46.8 % (ref 40.0–52.0)
HEMOGLOBIN: 16 g/dL (ref 13.0–18.0)
MCH: 29.2 pg (ref 26.0–34.0)
MCHC: 34.2 g/dL (ref 32.0–36.0)
MCV: 85.5 fL (ref 80.0–100.0)
Platelets: 190 10*3/uL (ref 150–440)
RBC: 5.47 MIL/uL (ref 4.40–5.90)
RDW: 13.4 % (ref 11.5–14.5)
WBC: 12.7 10*3/uL — AB (ref 3.8–10.6)

## 2016-07-10 MED ORDER — ONDANSETRON HCL 4 MG/2ML IJ SOLN
4.0000 mg | Freq: Once | INTRAMUSCULAR | Status: AC
  Administered 2016-07-10: 4 mg via INTRAVENOUS

## 2016-07-10 MED ORDER — ONDANSETRON HCL 4 MG/2ML IJ SOLN
INTRAMUSCULAR | Status: AC
  Administered 2016-07-10: 4 mg via INTRAVENOUS
  Filled 2016-07-10: qty 2

## 2016-07-10 NOTE — ED Triage Notes (Signed)
Pt ambulatory to triage with steady gait, no distress noted. Pt c/o of upper epigastric pain, N/V since this evening after eating dinner. Pt denies chest pain, fever and diarrhea. Pt is pale in color at time of triage.

## 2016-07-11 ENCOUNTER — Emergency Department: Payer: BLUE CROSS/BLUE SHIELD

## 2016-07-11 ENCOUNTER — Emergency Department
Admission: EM | Admit: 2016-07-11 | Discharge: 2016-07-11 | Disposition: A | Discharge status: Home / Self Care | Payer: BLUE CROSS/BLUE SHIELD | Attending: Emergency Medicine | Admitting: Emergency Medicine

## 2016-07-11 DIAGNOSIS — R109 Unspecified abdominal pain: Secondary | ICD-10-CM

## 2016-07-11 DIAGNOSIS — R1013 Epigastric pain: Secondary | ICD-10-CM

## 2016-07-11 DIAGNOSIS — K859 Acute pancreatitis without necrosis or infection, unspecified: Secondary | ICD-10-CM

## 2016-07-11 DIAGNOSIS — K2900 Acute gastritis without bleeding: Secondary | ICD-10-CM

## 2016-07-11 LAB — COMPREHENSIVE METABOLIC PANEL
ALT: 54 U/L (ref 17–63)
ANION GAP: 8 (ref 5–15)
AST: 33 U/L (ref 15–41)
Albumin: 4.6 g/dL (ref 3.5–5.0)
Alkaline Phosphatase: 48 U/L (ref 38–126)
BUN: 25 mg/dL — ABNORMAL HIGH (ref 6–20)
CO2: 25 mmol/L (ref 22–32)
Calcium: 9.6 mg/dL (ref 8.9–10.3)
Chloride: 103 mmol/L (ref 101–111)
Creatinine, Ser: 1.06 mg/dL (ref 0.61–1.24)
GFR calc non Af Amer: >60 mL/min (ref >60)
Glucose, Bld: 156 mg/dL — ABNORMAL HIGH (ref 65–99)
Potassium: 4 mmol/L (ref 3.5–5.1)
SODIUM: 136 mmol/L (ref 135–145)
Total Bilirubin: 0.7 mg/dL (ref 0.3–1.2)
Total Protein: 8 g/dL (ref 6.5–8.1)

## 2016-07-11 LAB — URINALYSIS, COMPLETE (UACMP) WITH MICROSCOPIC
BACTERIA UA: NONE SEEN
Bilirubin Urine: NEGATIVE
GLUCOSE, UA: NEGATIVE mg/dL
Hgb urine dipstick: NEGATIVE
Ketones, ur: NEGATIVE mg/dL
LEUKOCYTES UA: NEGATIVE
NITRITE: NEGATIVE
PH: 5 (ref 5.0–8.0)
Protein, ur: NEGATIVE mg/dL
SPECIFIC GRAVITY, URINE: 1.023 (ref 1.005–1.030)
Squamous Epithelial / LPF: NONE SEEN

## 2016-07-11 LAB — TROPONIN I: Troponin I: <0.03 ng/mL (ref <0.03)

## 2016-07-11 LAB — LIPASE, BLOOD: Lipase: 104 U/L — ABNORMAL HIGH (ref 11–51)

## 2016-07-11 MED ORDER — SUCRALFATE 1 G PO TABS: 1.0000 g | ORAL_TABLET | Freq: Two times a day (BID) | ORAL | 0 refills | Status: DC

## 2016-07-11 MED ORDER — TRAMADOL HCL 50 MG PO TABS: 50.0000 mg | ORAL_TABLET | Freq: Four times a day (QID) | ORAL | 0 refills | Status: DC | PRN

## 2016-07-11 MED ORDER — MORPHINE SULFATE (PF) 4 MG/ML IV SOLN: 4.0000 mg | Freq: Once | INTRAVENOUS | Status: DC

## 2016-07-11 MED ORDER — MORPHINE SULFATE (PF) 2 MG/ML IV SOLN
INTRAVENOUS | Status: AC
  Administered 2016-07-11: 4 mg via INTRAVENOUS
  Filled 2016-07-11: qty 2

## 2016-07-11 MED ORDER — ONDANSETRON 4 MG PO TBDP: 4.0000 mg | ORAL_TABLET | Freq: Three times a day (TID) | ORAL | 0 refills | Status: DC | PRN

## 2016-07-11 NOTE — ED Notes (Signed)

## 2016-07-11 NOTE — ED Notes (Signed)
Pt. Returned to tx. room in stable condition with no acute changes since departure from unit for scans.   

## 2016-07-11 NOTE — ED Notes (Signed)
Pt in US at this time. Family in room denies needs.  

## 2016-07-11 NOTE — ED Notes (Signed)
Pt ambulatory in triage subwait hallway; st relief of nausea but 8/10 epigastric pain, nonradiating; Dr Zenda Alpers notified and med ordered

## 2016-07-11 NOTE — ED Provider Notes (Signed)
[facility] Emergency Department Provider Note   ____________________________________________   First MD Initiated Contact with Patient 07/11/16 [phone]     (approximate)  I have reviewed the triage vital signs and the nursing notes.   HISTORY  Chief Complaint Abdominal Pain    HPI Glenn Reed is a 51 y.o. male who comes into the hospital today with some abdominal pain. The patient reports he ate dinner yesterday and had some Metamucil prior. He reports he went to mow the grass and 8:00 PM he started having some epigastric pain. He reports it felt like acid but he was also nauseous and vomiting. He states that the pain is gone now. He's never had pain like this in the past. The patient reports that he feels that because the Metamucil with citrus that the acid cause some of this pain. The patient denies any chest pain or shortness of breath. He denies any diarrhea as well. The patient denies any fevers dizziness or lightheadedness. Again he reports that his pain at this time is gone. He did come in for evaluation.   Past Medical History:  Diagnosis Date  . Headache   . Pre-diabetes   . Rectal pain, chronic   . Shoulder pain     There are no active problems to display for this patient.   Past Surgical History:  Procedure Laterality Date  . SHOULDER SURGERY      Prior to Admission medications   Medication Sig Start Date End Date Taking? Authorizing Provider  diclofenac sodium (VOLTAREN) 1 % GEL Apply 2 g topically 4 (four) times daily.    Historical Provider, MD  hydrocortisone (ANUSOL-HC) 2.5 % rectal cream Place 1 application rectally 2 (two) times daily.    Historical Provider, MD  ondansetron (ZOFRAN ODT) 4 MG disintegrating tablet Take 1 tablet (4 mg total) by mouth every 8 (eight) hours as needed for nausea or vomiting. 07/11/16   Rebecka Apley, MD  polyethylene glycol (MIRALAX / Ethelene Hal) packet Take 17 g by mouth daily.    Historical  Provider, MD  sucralfate (CARAFATE) 1 g tablet Take 1 tablet (1 g total) by mouth 2 (two) times daily. 07/11/16   Rebecka Apley, MD  traMADol (ULTRAM) 50 MG tablet Take 1 tablet (50 mg total) by mouth every 6 (six) hours as needed. 07/11/16   Rebecka Apley, MD    Allergies Prednisone  History reviewed. No pertinent family history.  Social History Social History  Substance Use Topics  . Smoking status: Never Smoker  . Smokeless tobacco: Never Used  . Alcohol use No    Review of Systems Constitutional: No fever/chills Eyes: No visual changes. ENT: No sore throat. Cardiovascular: Denies chest pain. Respiratory: Denies shortness of breath. Gastrointestinal:  abdominal pain.  nausea,  vomiting.  No diarrhea.  No constipation. Genitourinary: Negative for dysuria. Musculoskeletal: Negative for back pain. Skin: Negative for rash. Neurological: Negative for headaches, focal weakness or numbness.  10-point ROS otherwise negative.  ____________________________________________   PHYSICAL EXAM:  VITAL SIGNS: ED Triage Vitals  Enc Vitals Group     BP 07/10/16 2339 110/85     Pulse Rate 07/10/16 2339 74     Resp 07/10/16 2339 16     Temp 07/10/16 2339 97.8 F (36.6 C)     Temp Source 07/10/16 2339 Oral     SpO2 07/10/16 2339 98 %     Weight 07/10/16 2339 200 lb (90.7 kg)     Height 07/10/16 2339  5\' 7"  (1.702 m)     Head Circumference --      Peak Flow --      Pain Score 07/11/16 0148 3     Pain Loc --      Pain Edu? --      Excl. in GC? --     Constitutional: Alert and oriented. Well appearing and in mild distress. Eyes: Conjunctivae are normal. PERRL. EOMI. Head: Atraumatic. Nose: No congestion/rhinnorhea. Mouth/Throat: Mucous membranes are moist.  Oropharynx non-erythematous. Cardiovascular: Normal rate, regular rhythm. Grossly normal heart sounds.  Good peripheral circulation. Respiratory: Normal respiratory effort.  No retractions. Lungs  CTAB. Gastrointestinal: Soft and nontender. No distention. Positive bowel sounds Musculoskeletal: No lower extremity tenderness nor edema.  . Neurologic:  Normal speech and language.  Skin:  Skin is warm, dry and intact.  Psychiatric: Mood and affect are normal.   ____________________________________________   LABS (all labs ordered are listed, but only abnormal results are displayed)  Labs Reviewed  LIPASE, BLOOD - Abnormal; Notable for the following:       Result Value   Lipase 104 (*)    All other components within normal limits  COMPREHENSIVE METABOLIC PANEL - Abnormal; Notable for the following:    Glucose, Bld 156 (*)    BUN 25 (*)    All other components within normal limits  CBC - Abnormal; Notable for the following:    WBC 12.7 (*)    All other components within normal limits  URINALYSIS, COMPLETE (UACMP) WITH MICROSCOPIC - Abnormal; Notable for the following:    Color, Urine YELLOW (*)    APPearance CLEAR (*)    All other components within normal limits  TROPONIN I   ____________________________________________  EKG  ED ECG REPORT I, Rebecka Apley, the attending physician, personally viewed and interpreted this ECG.   Date: 07/10/2016  EKG Time: 2345  Rate: 82  Rhythm: normal sinus rhythm  Axis: normal  Intervals:none  ST&T Change: none  ____________________________________________  RADIOLOGY  [place] abd RUQ ____________________________________________   PROCEDURES  Procedure(s) performed: None  Procedures  Critical Care performed: No  ____________________________________________   INITIAL IMPRESSION / ASSESSMENT AND PLAN / ED COURSE  Pertinent labs & imaging results that were available during my care of the patient were reviewed by me and considered in my medical decision making (see chart for details).  This is a 51 year old who comes into the hospital today with some epigastric pain. The patient's lipase is elevated so I did send him  for an ultrasound to evaluate for possible gallstones. The patient's ultrasound was negative. The remainder of his blood work is also unremarkable. The patient reports this pain is improved at this time. I feel that the pancreatitis is what causing twist symptoms but also may be some gastritis. I informed the patient of these concerns and told him that he needs to follow-up with his doctor as well as a GI physician. The patient understands and agrees with the plan as stated. I will write the patient a prescription for some Carafate for home and have him follow-up with his primary care physician as well as GI. The patient will be discharged home to follow-up. His troponin was also unremarkable and the patient has no other concerns at this time.  Clinical Course as of Jul 12 719  Thu Jul 11, 2016  0528 Normal right upper quadrant ultrasound. US Abdomen Limited RUQ [AW]    Clinical Course User Index [AW] Rebecka Apley , MD  ____________________________________________   FINAL CLINICAL IMPRESSION(S) / ED DIAGNOSES  Final diagnoses:  Abdominal pain  Epigastric pain  Acute pancreatitis, unspecified complication status, unspecified pancreatitis type  Acute gastritis without hemorrhage, unspecified gastritis type      NEW MEDICATIONS STARTED DURING THIS VISIT:  Discharge Medication List as of 07/11/2016  5:33 AM    START taking these medications   Details  ondansetron (ZOFRAN ODT) 4 MG disintegrating tablet Take 1 tablet (4 mg total) by mouth every 8 (eight) hours as needed for nausea or vomiting., Starting Thu 07/11/2016, Print    sucralfate (CARAFATE) 1 g tablet Take 1 tablet (1 g total) by mouth 2 (two) times daily., Starting Thu 07/11/2016, Print    traMADol (ULTRAM) 50 MG tablet Take 1 tablet (50 mg total) by mouth every 6 (six) hours as needed., Starting Thu 07/11/2016, Print         Note:  This document was prepared using Dragon voice recognition software and may include  unintentional dictation errors.    Rebecka Apley, MD 07/11/16 [phone]

## 2017-01-18 ENCOUNTER — Observation Stay
Admission: EM | Admit: 2017-01-18 | Discharge: 2017-01-20 | Disposition: A | Discharge status: Home / Self Care | Payer: BLUE CROSS/BLUE SHIELD | Attending: Surgery | Admitting: Surgery

## 2017-01-18 ENCOUNTER — Emergency Department: Payer: BLUE CROSS/BLUE SHIELD | Admitting: Anesthesiology

## 2017-01-18 ENCOUNTER — Emergency Department: Payer: BLUE CROSS/BLUE SHIELD

## 2017-01-18 ENCOUNTER — Encounter
Admission: EM | Disposition: A | Discharge status: Home / Self Care | Payer: Self-pay | Source: Home / Self Care | Attending: Emergency Medicine

## 2017-01-18 ENCOUNTER — Encounter: Payer: Self-pay | Admitting: Anesthesiology

## 2017-01-18 DIAGNOSIS — R7303 Prediabetes: Secondary | ICD-10-CM | POA: Insufficient documentation

## 2017-01-18 DIAGNOSIS — K3589 Other acute appendicitis without perforation or gangrene: Secondary | ICD-10-CM

## 2017-01-18 DIAGNOSIS — K353 Acute appendicitis with localized peritonitis, without perforation or gangrene: Secondary | ICD-10-CM | POA: Diagnosis not present

## 2017-01-18 DIAGNOSIS — R51 Headache: Secondary | ICD-10-CM | POA: Diagnosis not present

## 2017-01-18 DIAGNOSIS — Z79899 Other long term (current) drug therapy: Secondary | ICD-10-CM | POA: Diagnosis not present

## 2017-01-18 DIAGNOSIS — R000 Tachycardia, unspecified: Secondary | ICD-10-CM | POA: Diagnosis present

## 2017-01-18 DIAGNOSIS — K219 Gastro-esophageal reflux disease without esophagitis: Secondary | ICD-10-CM | POA: Insufficient documentation

## 2017-01-18 HISTORY — PX: LAPAROSCOPIC APPENDECTOMY: SHX408

## 2017-01-18 LAB — CBC
HCT: 42.8 % (ref 40.0–52.0)
Hemoglobin: 14.8 g/dL (ref 13.0–18.0)
MCH: 29.8 pg (ref 26.0–34.0)
MCHC: 34.5 g/dL (ref 32.0–36.0)
MCV: 86.4 fL (ref 80.0–100.0)
PLATELETS: 159 10*3/uL (ref 150–440)
RBC: 4.95 MIL/uL (ref 4.40–5.90)
RDW: 13.6 % (ref 11.5–14.5)
WBC: 8.7 10*3/uL (ref 3.8–10.6)

## 2017-01-18 LAB — URINALYSIS, COMPLETE (UACMP) WITH MICROSCOPIC
Bacteria, UA: NONE SEEN
Bilirubin Urine: NEGATIVE
GLUCOSE, UA: NEGATIVE mg/dL
Hgb urine dipstick: NEGATIVE
Ketones, ur: 5 mg/dL — AB
Leukocytes, UA: NEGATIVE
Nitrite: NEGATIVE
PH: 6 (ref 5.0–8.0)
Protein, ur: 30 mg/dL — AB
RBC / HPF: NONE SEEN RBC/hpf (ref 0–5)
SPECIFIC GRAVITY, URINE: 1.024 (ref 1.005–1.030)
Squamous Epithelial / LPF: NONE SEEN

## 2017-01-18 LAB — COMPREHENSIVE METABOLIC PANEL
ALT: 62 U/L (ref 17–63)
AST: 38 U/L (ref 15–41)
Albumin: 4.2 g/dL (ref 3.5–5.0)
Alkaline Phosphatase: 51 U/L (ref 38–126)
Anion gap: 7 (ref 5–15)
BUN: 23 mg/dL — ABNORMAL HIGH (ref 6–20)
CHLORIDE: 103 mmol/L (ref 101–111)
CO2: 28 mmol/L (ref 22–32)
CREATININE: 1.11 mg/dL (ref 0.61–1.24)
Calcium: 9 mg/dL (ref 8.9–10.3)
GFR calc non Af Amer: >60 mL/min (ref >60)
Glucose, Bld: 143 mg/dL — ABNORMAL HIGH (ref 65–99)
POTASSIUM: 4.2 mmol/L (ref 3.5–5.1)
SODIUM: 138 mmol/L (ref 135–145)
Total Bilirubin: 0.9 mg/dL (ref 0.3–1.2)
Total Protein: 7.4 g/dL (ref 6.5–8.1)

## 2017-01-18 LAB — PROTIME-INR
INR: 1.11
Prothrombin Time: 14.2 seconds (ref 11.4–15.2)

## 2017-01-18 LAB — TYPE AND SCREEN
ABO/RH(D): O POS
ANTIBODY SCREEN: NEGATIVE

## 2017-01-18 LAB — LIPASE, BLOOD: Lipase: 25 U/L (ref 11–51)

## 2017-01-18 LAB — APTT: aPTT: 26 seconds (ref 24–36)

## 2017-01-18 SURGERY — APPENDECTOMY, LAPAROSCOPIC
Anesthesia: General | Site: Abdomen | Wound class: Contaminated

## 2017-01-18 MED ORDER — FAMOTIDINE IN NACL 20-0.9 MG/50ML-% IV SOLN
20.0000 mg | Freq: Once | INTRAVENOUS | Status: AC
  Administered 2017-01-18: 20 mg via INTRAVENOUS
  Filled 2017-01-18: qty 50

## 2017-01-18 MED ORDER — MIDAZOLAM HCL 2 MG/2ML IJ SOLN
INTRAMUSCULAR | Status: AC
  Filled 2017-01-18: qty 2

## 2017-01-18 MED ORDER — SODIUM CHLORIDE 0.9 % IV BOLUS (SEPSIS)
1000.0000 mL | Freq: Once | INTRAVENOUS | Status: AC
  Administered 2017-01-18: 1000 mL via INTRAVENOUS

## 2017-01-18 MED ORDER — PIPERACILLIN-TAZOBACTAM 3.375 G IVPB 30 MIN
3.3750 g | Freq: Once | INTRAVENOUS | Status: AC
  Administered 2017-01-18: 3.375 g via INTRAVENOUS
  Filled 2017-01-18: qty 50

## 2017-01-18 MED ORDER — FENTANYL CITRATE (PF) 100 MCG/2ML IJ SOLN
INTRAMUSCULAR | Status: AC
  Filled 2017-01-18: qty 2

## 2017-01-18 MED ORDER — PROPOFOL 10 MG/ML IV BOLUS
INTRAVENOUS | Status: AC
  Filled 2017-01-18: qty 20

## 2017-01-18 MED ORDER — BUPIVACAINE HCL (PF) 0.5 % IJ SOLN
INTRAMUSCULAR | Status: AC
  Filled 2017-01-18: qty 30

## 2017-01-18 MED ORDER — ONDANSETRON HCL 4 MG/2ML IJ SOLN
4.0000 mg | Freq: Once | INTRAMUSCULAR | Status: AC
  Administered 2017-01-18: 4 mg via INTRAVENOUS
  Filled 2017-01-18: qty 2

## 2017-01-18 MED ORDER — FENTANYL CITRATE (PF) 100 MCG/2ML IJ SOLN
75.0000 ug | Freq: Once | INTRAMUSCULAR | Status: AC
  Administered 2017-01-18: 75 ug via INTRAVENOUS
  Filled 2017-01-18: qty 2

## 2017-01-18 MED ORDER — LIDOCAINE HCL (PF) 1 % IJ SOLN
INTRAMUSCULAR | Status: AC
  Filled 2017-01-18: qty 30

## 2017-01-18 SURGICAL SUPPLY — 42 items
BLADE SURG SZ11 CARB STEEL (BLADE) ×3 IMPLANT
CANISTER SUCT 1200ML W/VALVE (MISCELLANEOUS) ×3 IMPLANT
CANISTER SUCT 3000ML PPV (MISCELLANEOUS) IMPLANT
CHLORAPREP W/TINT 26ML (MISCELLANEOUS) ×3 IMPLANT
CUTTER FLEX LINEAR 45M (STAPLE) ×3 IMPLANT
DECANTER SPIKE VIAL GLASS SM (MISCELLANEOUS) ×6 IMPLANT
DERMABOND ADVANCED (GAUZE/BANDAGES/DRESSINGS) ×2
DERMABOND ADVANCED .7 DNX12 (GAUZE/BANDAGES/DRESSINGS) ×1 IMPLANT
ELECT REM PT RETURN 9FT ADLT (ELECTROSURGICAL) ×3
ELECTRODE REM PT RTRN 9FT ADLT (ELECTROSURGICAL) ×1 IMPLANT
GLOVE BIO SURGEON STRL SZ7 (GLOVE) ×9 IMPLANT
GLOVE BIOGEL PI IND STRL 7.5 (GLOVE) ×2 IMPLANT
GLOVE BIOGEL PI INDICATOR 7.5 (GLOVE) ×4
GOWN STRL REUS W/ TWL LRG LVL4 (GOWN DISPOSABLE) ×1 IMPLANT
GOWN STRL REUS W/ TWL XL LVL3 (GOWN DISPOSABLE) ×1 IMPLANT
GOWN STRL REUS W/TWL LRG LVL4 (GOWN DISPOSABLE) ×2
GOWN STRL REUS W/TWL XL LVL3 (GOWN DISPOSABLE) ×2
GRASPER SUT TROCAR 14GX15 (MISCELLANEOUS) ×3 IMPLANT
IRRIGATION STRYKERFLOW (MISCELLANEOUS) IMPLANT
IRRIGATOR STRYKERFLOW (MISCELLANEOUS)
IV NS 1000ML (IV SOLUTION) ×2
IV NS 1000ML BAXH (IV SOLUTION) ×1 IMPLANT
KIT RM TURNOVER STRD PROC AR (KITS) ×3 IMPLANT
NEEDLE HYPO 22GX1.5 SAFETY (NEEDLE) ×3 IMPLANT
NEEDLE INSUFFLATION 14GA 120MM (NEEDLE) ×3 IMPLANT
NS IRRIG 1000ML POUR BTL (IV SOLUTION) ×3 IMPLANT
NS IRRIG 500ML POUR BTL (IV SOLUTION) ×3 IMPLANT
PACK LAP CHOLECYSTECTOMY (MISCELLANEOUS) ×3 IMPLANT
POUCH SPECIMEN RETRIEVAL 10MM (ENDOMECHANICALS) ×3 IMPLANT
RELOAD 45 VASCULAR/THIN (ENDOMECHANICALS) IMPLANT
RELOAD STAPLE TA45 3.5 REG BLU (ENDOMECHANICALS) ×3 IMPLANT
SHEARS HARMONIC ACE PLUS 36CM (ENDOMECHANICALS) ×3 IMPLANT
SLEEVE ENDOPATH XCEL 5M (ENDOMECHANICALS) ×3 IMPLANT
SOL .9 NS 3000ML IRR  AL (IV SOLUTION)
SOL .9 NS 3000ML IRR UROMATIC (IV SOLUTION) IMPLANT
SUT MNCRL AB 4-0 PS2 18 (SUTURE) ×3 IMPLANT
SUT VICRYL 0 UR6 27IN ABS (SUTURE) ×3 IMPLANT
SUT VICRYL AB 3-0 FS1 BRD 27IN (SUTURE) ×3 IMPLANT
TRAY FOLEY W/METER SILVER 16FR (SET/KITS/TRAYS/PACK) ×3 IMPLANT
TROCAR XCEL 12X100 BLDLESS (ENDOMECHANICALS) ×3 IMPLANT
TROCAR XCEL NON-BLD 5MMX100MML (ENDOMECHANICALS) ×3 IMPLANT
TUBING INSUFFLATION (TUBING) ×3 IMPLANT

## 2017-01-18 NOTE — ED Notes (Signed)
Pt attempting to give urine sample at this time.

## 2017-01-18 NOTE — H&P (Signed)
SURGICAL ADMISSION HISTORY & PHYSICAL  HISTORY OF PRESENT ILLNESS (HPI):  51 y.o. male presented to [facility] ED for evaluation of abdominal pain. Via interpreter services, patient reports mild generalized abdominal pain began around noon today prior to lunch. Patient 3 days ago stopped taking Prilosec he'd been prescribed 3 months ago for gastritis and with relief of his severe GERD. Accordingly, he took Prilosec and ate lunch, but sought ED evaluation when the pain did not improve but instead worsened and became focused in the RLQ > lower abdomen. He otherwise reports nausea, vomiting, and chills, denies fever, diarrhea, constipation, CP, or SOB. He likewise denies ever having experienced CP or SOB with walking several blocks or up/down a flight of steps. While in the ED, patient says his abdominal pain has been well-controlled.  Surgery is consulted by ED physician Dr. Sharma Covert in this context for evaluation and management of acute appendicitis.  PAST MEDICAL HISTORY (PMH):  Past Medical History:  Diagnosis Date  . Headache   . Pre-diabetes   . Rectal pain, chronic   . Shoulder pain      PAST SURGICAL HISTORY [facility]):  Past Surgical History:  Procedure Laterality Date  . SHOULDER SURGERY       MEDICATIONS:  Prior to Admission medications   Medication Sig Start Date End Date Taking? Authorizing Provider  diclofenac sodium (VOLTAREN) 1 % GEL Apply 2 g topically 4 (four) times daily.    [provider]  hydrocortisone (ANUSOL-HC) 2.5 % rectal cream Place 1 application rectally 2 (two) times daily.    [provider]  ondansetron (ZOFRAN ODT) 4 MG disintegrating tablet Take 1 tablet (4 mg total) by mouth every 8 (eight) hours as needed for nausea or vomiting. 07/11/16   Rebecka Apley, MD  polyethylene glycol (MIRALAX / GLYCOLAX) packet Take 17 g by mouth daily.    [provider]  sucralfate (CARAFATE) 1 g tablet Take 1 tablet (1 g total) by mouth 2 (two) times  daily. 07/11/16   Rebecka Apley, MD  traMADol (ULTRAM) 50 MG tablet Take 1 tablet (50 mg total) by mouth every 6 (six) hours as needed. 07/11/16   Rebecka Apley, MD     ALLERGIES:  Allergies  Allergen Reactions  . Prednisone Other (See Comments)    psychosis     SOCIAL HISTORY:  Social History   Social History  . Marital status: Married    Spouse name: N/A  . Number of children: N/A  . Years of education: N/A   Occupational History  . Not on file.   Social History Main Topics  . Smoking status: Never Smoker  . Smokeless tobacco: Never Used  . Alcohol use No  . Drug use: No  . Sexual activity: Not on file   Other Topics Concern  . Not on file   Social History Narrative  . No narrative on file    The patient currently resides (home / rehab facility / nursing home): Home The patient normally is (ambulatory / bedbound): Ambulatory   FAMILY HISTORY:  No family history on file.   REVIEW OF SYSTEMS:  Constitutional: denies weight loss, fever, chills, or sweats  Eyes: denies any other vision changes, history of eye injury  ENT: denies sore throat, hearing problems  Respiratory: denies shortness of breath, wheezing  Cardiovascular: denies chest pain, palpitations  Gastrointestinal: abdominal pain, N/V, and bowel function as per HPI Genitourinary: denies burning with urination or urinary frequency Musculoskeletal: denies any other joint pains  or cramps  Skin: denies any other rashes or skin discolorations  Neurological: denies any other headache, dizziness, weakness  Psychiatric: denies any other depression, anxiety   All other review of systems were negative   VITAL SIGNS:  Temp:  [98.8 F (37.1 C)] 98.8 F (37.1 C) (10/20 1828) Pulse Rate:  [69-97] 97 (10/20 2000) Resp:  [16-27] 23 (10/20 2000) BP: (121-139)/(72-77) 139/76 (10/20 2000) SpO2:  [95 %-100 %] 95 % (10/20 2000)             INTAKE/OUTPUT:  This shift: No intake/output data recorded.  Last  2 shifts: @IOLAST2SHIFTS @   PHYSICAL EXAM:  Constitutional:  -- Normal body habitus  -- Awake, alert, and oriented x3  Eyes:  -- Pupils equally round and reactive to light  -- No scleral icterus  Ear, nose, and throat:  -- No jugular venous distension  Pulmonary:  -- No crackles  -- Equal breath sounds bilaterally -- Breathing non-labored at rest Cardiovascular:  -- S1, S2 present  -- No pericardial rubs Gastrointestinal:  -- Abdomen soft and non-distended with moderate RLQ > less severe lower abdominal tenderness to palpation, no guarding or rebound tenderness -- No abdominal masses appreciated, pulsatile or otherwise  Musculoskeletal and Integumentary:  -- Wounds or skin discoloration: None appreciated -- Extremities: B/L UE and LE FROM, hands and feet warm, no edema  Neurologic:  -- Motor function: intact and symmetric -- Sensation: intact and symmetric  Labs:  CBC Latest Ref Rng & Units 01/18/2017 07/10/2016 10/15/2015  WBC 3.8 - 10.6 K/uL 8.7 12.7(H) 10.6  Hemoglobin 13.0 - 18.0 g/dL 16.114.8 09.616.0 04.516.5  Hematocrit 40.0 - 52.0 % 42.8 46.8 46.8  Platelets 150 - 440 K/uL 159 190 157   CMP Latest Ref Rng & Units 01/18/2017 07/10/2016 10/15/2015  Glucose 65 - 99 mg/dL 409(W143(H) 119(J156(H) 478(G110(H)  BUN 6 - 20 mg/dL 95(A23(H) 21(H25(H) 18  Creatinine 0.61 - 1.24 mg/dL 0.861.11 5.781.06 4.690.98  Sodium 135 - 145 mmol/L 138 136 140  Potassium 3.5 - 5.1 mmol/L 4.2 4.0 4.0  Chloride 101 - 111 mmol/L 103 103 103  CO2 22 - 32 mmol/L 28 25 29   Calcium 8.9 - 10.3 mg/dL 9.0 9.6 6.2(X8.7(L)  Total Protein 6.5 - 8.1 g/dL 7.4 8.0 7.3  Total Bilirubin 0.3 - 1.2 mg/dL 0.9 0.7 0.6  Alkaline Phos 38 - 126 U/L 51 48 49  AST 15 - 41 U/L 38 33 23  ALT 17 - 63 U/L 62 54 31   Imaging studies:  CT Abdomen and Pelvis without Contrast (01/18/2017) - personally reviewed and discussed with patient Appendix dilated up to 11 mm with associated periappendiceal inflammatory fat stranding, suspicious for acute appendicitis (series 2,  image 61). No calcified appendicolith identified. No free air to suggest perforation or other complication. No other acute inflammatory changes seen about the bowels. Stomach within normal limits. No evidence for bowel obstruction. Small volume free fluid within the pelvis, likely reactive. No free intraperitoneal air.  Assessment/Plan: (ICD-10's: K35.3) 51 y.o. male with acute appendicitis with localized peritonitis, complicated by pertinent comorbidities including GERD with gastritis relieved with Prilosec and pre-diabetes.   - NPO, IVF   - IV antibiotics (ceftriaxone and metronidazole preferred)   - all risks, benefits, and alternatives to appendectomy were discussed with the patient and his wife via interpreter services, all of their questions were answered to their expressed satisfaction, patient expresses he wishes to proceed, and informed consent was accordingly obtained.  - will plan for laparoscopic  appendectomy tonight pending OR availability  All of the above findings and recommendations were discussed with the patient, his family, his ED physician, and his RN, and all of patient's and his family's questions were answered to their expressed satisfaction.  -- Scherrie Gerlach Earlene Plater, MD, RPVI [place]: [facility] - Partnering for exceptional care. Office: [phone]

## 2017-01-18 NOTE — ED Triage Notes (Signed)
Pt came to ED via pov c/o abdominal pain. Pt pointing to lower abdomen. Pt speaks minimal English. Brought pt back to room and interpreter paged.

## 2017-01-18 NOTE — Consult Note (Signed)
SURGICAL CONSULTATION NOTE (initial) - cpt: [id number]  HISTORY OF PRESENT ILLNESS (HPI):  51 y.o. male presented to [facility] ED for evaluation of abdominal pain. Via interpreter services, patient reports mild generalized abdominal pain began around noon today prior to lunch. Patient 3 days ago stopped taking Prilosec he'd been prescribed 3 months ago for gastritis and with relief of his severe GERD. Accordingly, he took Prilosec and ate lunch, but sought ED evaluation when the pain did not improve but instead worsened and became focused in the RLQ > lower abdomen. He otherwise reports nausea, vomiting, and chills, denies fever, diarrhea, constipation, CP, or SOB. He likewise denies ever having experienced CP or SOB with walking several blocks or up/down a flight of steps. While in the ED, patient says his abdominal pain has been well-controlled.  Surgery is consulted by ED physician Dr. Sharma Covert in this context for evaluation and management of acute appendicitis.  PAST MEDICAL HISTORY (PMH):  Past Medical History:  Diagnosis Date  . Headache   . Pre-diabetes   . Rectal pain, chronic   . Shoulder pain      PAST SURGICAL HISTORY [facility]):  Past Surgical History:  Procedure Laterality Date  . SHOULDER SURGERY       MEDICATIONS:  Prior to Admission medications   Medication Sig Start Date End Date Taking? Authorizing Provider  diclofenac sodium (VOLTAREN) 1 % GEL Apply 2 g topically 4 (four) times daily.    [provider]  hydrocortisone (ANUSOL-HC) 2.5 % rectal cream Place 1 application rectally 2 (two) times daily.    [provider]  ondansetron (ZOFRAN ODT) 4 MG disintegrating tablet Take 1 tablet (4 mg total) by mouth every 8 (eight) hours as needed for nausea or vomiting. 07/11/16   Rebecka Apley, MD  polyethylene glycol (MIRALAX / GLYCOLAX) packet Take 17 g by mouth daily.    [provider]  sucralfate (CARAFATE) 1 g tablet Take 1 tablet (1 g total) by mouth 2  (two) times daily. 07/11/16   Rebecka Apley, MD  traMADol (ULTRAM) 50 MG tablet Take 1 tablet (50 mg total) by mouth every 6 (six) hours as needed. 07/11/16   Rebecka Apley, MD     ALLERGIES:  Allergies  Allergen Reactions  . Prednisone Other (See Comments)    psychosis     SOCIAL HISTORY:  Social History   Social History  . Marital status: Married    Spouse name: N/A  . Number of children: N/A  . Years of education: N/A   Occupational History  . Not on file.   Social History Main Topics  . Smoking status: Never Smoker  . Smokeless tobacco: Never Used  . Alcohol use No  . Drug use: No  . Sexual activity: Not on file   Other Topics Concern  . Not on file   Social History Narrative  . No narrative on file    The patient currently resides (home / rehab facility / nursing home): Home The patient normally is (ambulatory / bedbound): Ambulatory   FAMILY HISTORY:  No family history on file.   REVIEW OF SYSTEMS:  Constitutional: denies weight loss, fever, chills, or sweats  Eyes: denies any other vision changes, history of eye injury  ENT: denies sore throat, hearing problems  Respiratory: denies shortness of breath, wheezing  Cardiovascular: denies chest pain, palpitations  Gastrointestinal: abdominal pain, N/V, and bowel function as per HPI Genitourinary: denies burning with urination or urinary frequency Musculoskeletal: denies any other  joint pains or cramps  Skin: denies any other rashes or skin discolorations  Neurological: denies any other headache, dizziness, weakness  Psychiatric: denies any other depression, anxiety   All other review of systems were negative   VITAL SIGNS:  Temp:  [98.8 F (37.1 C)] 98.8 F (37.1 C) (10/20 1828) Pulse Rate:  [69-97] 97 (10/20 2000) Resp:  [16-27] 23 (10/20 2000) BP: (121-139)/(72-77) 139/76 (10/20 2000) SpO2:  [95 %-100 %] 95 % (10/20 2000)             INTAKE/OUTPUT:  This shift: No intake/output data  recorded.  Last 2 shifts: @IOLAST2SHIFTS @   PHYSICAL EXAM:  Constitutional:  -- Normal body habitus  -- Awake, alert, and oriented x3  Eyes:  -- Pupils equally round and reactive to light  -- No scleral icterus  Ear, nose, and throat:  -- No jugular venous distension  Pulmonary:  -- No crackles  -- Equal breath sounds bilaterally -- Breathing non-labored at rest Cardiovascular:  -- S1, S2 present  -- No pericardial rubs Gastrointestinal:  -- Abdomen soft and non-distended with moderate RLQ > less severe lower abdominal tenderness to palpation, no guarding or rebound tenderness -- No abdominal masses appreciated, pulsatile or otherwise  Musculoskeletal and Integumentary:  -- Wounds or skin discoloration: None appreciated -- Extremities: B/L UE and LE FROM, hands and feet warm, no edema  Neurologic:  -- Motor function: intact and symmetric -- Sensation: intact and symmetric  Labs:  CBC Latest Ref Rng & Units 01/18/2017 07/10/2016 10/15/2015  WBC 3.8 - 10.6 K/uL 8.7 12.7(H) 10.6  Hemoglobin 13.0 - 18.0 g/dL 16.1 09.6 04.5  Hematocrit 40.0 - 52.0 % 42.8 46.8 46.8  Platelets 150 - 440 K/uL 159 190 157   CMP Latest Ref Rng & Units 01/18/2017 07/10/2016 10/15/2015  Glucose 65 - 99 mg/dL 409(W) 119(J) 478(G)  BUN 6 - 20 mg/dL 95(A) 21(H) 18  Creatinine 0.61 - 1.24 mg/dL 0.86 5.78 4.69  Sodium 135 - 145 mmol/L 138 136 140  Potassium 3.5 - 5.1 mmol/L 4.2 4.0 4.0  Chloride 101 - 111 mmol/L 103 103 103  CO2 22 - 32 mmol/L 28 25 29   Calcium 8.9 - 10.3 mg/dL 9.0 9.6 6.2(X)  Total Protein 6.5 - 8.1 g/dL 7.4 8.0 7.3  Total Bilirubin 0.3 - 1.2 mg/dL 0.9 0.7 0.6  Alkaline Phos 38 - 126 U/L 51 48 49  AST 15 - 41 U/L 38 33 23  ALT 17 - 63 U/L 62 54 31   Imaging studies:  CT Abdomen and Pelvis without Contrast (01/18/2017) - personally reviewed and discussed with patient Appendix dilated up to 11 mm with associated periappendiceal inflammatory fat stranding, suspicious for  acute appendicitis (series 2, image 61). No calcified appendicolith identified. No free air to suggest perforation or other complication. No other acute inflammatory changes seen about the bowels. Stomach within normal limits. No evidence for bowel obstruction. Small volume free fluid within the pelvis, likely reactive. No free intraperitoneal air.  Assessment/Plan: (ICD-10's: K35.3) 51 y.o. male with acute appendicitis with localized peritonitis, complicated by pertinent comorbidities including GERD with gastritis relieved with Prilosec and pre-diabetes.   - NPO, IVF   - IV antibiotics (ceftriaxone and metronidazole preferred)   - all risks, benefits, and alternatives to appendectomy were discussed with the patient and his wife via interpreter services, all of their questions were answered to their expressed satisfaction, patient expresses he wishes to proceed, and informed consent was accordingly obtained.  - will plan  for laparoscopic appendectomy tonight pending OR availability  All of the above findings and recommendations were discussed with the patient, his family, his ED physician, and his RN, and all of patient's and his family's questions were answered to their expressed satisfaction.  Thank you for the opportunity to participate in this patient's care.   -- Scherrie GerlachJason E. Earlene Plateravis, MD, RPVI [place]: [facility] General [facility] - Partnering for exceptional care. Office: [phone]

## 2017-01-18 NOTE — Anesthesia Preprocedure Evaluation (Signed)
Anesthesia Evaluation  Patient identified by MRN, date of birth, ID band Patient awake    Reviewed: Allergy & Precautions, NPO status , Patient's Chart, lab work & pertinent test results, reviewed documented beta blocker date and time   Airway Mallampati: III  TM Distance: >3 FB     Dental  (+) Chipped   Pulmonary           Cardiovascular      Neuro/Psych  Headaches,    GI/Hepatic   Endo/Other    Renal/GU      Musculoskeletal   Abdominal   Peds  Hematology   Anesthesia Other Findings EKG reviewed and ok.  Reproductive/Obstetrics                             Anesthesia Physical Anesthesia Plan  ASA: II  Anesthesia Plan: General   Post-op Pain Management:    Induction: Intravenous and Rapid sequence  PONV Risk Score and Plan:   Airway Management Planned: Oral ETT  Additional Equipment:   Intra-op Plan:   Post-operative Plan:   Informed Consent: I have reviewed the patients History and Physical, chart, labs and discussed the procedure including the risks, benefits and alternatives for the proposed anesthesia with the patient or authorized representative who has indicated his/her understanding and acceptance.     Plan Discussed with: CRNA  Anesthesia Plan Comments:         Anesthesia Quick Evaluation

## 2017-01-18 NOTE — ED Notes (Signed)
Pt taken to CT.

## 2017-01-18 NOTE — ED Notes (Signed)
Pt states he has had this type of pain 3 times in the past. He says he has 9/10 pain.

## 2017-01-18 NOTE — ED Provider Notes (Signed)
[facility] Emergency Department Provider Note  ____________________________________________  Time seen: Approximately 7:37 PM  I have reviewed the triage vital signs and the nursing notes.   HISTORY  Chief Complaint Abdominal Pain    HPI Glenn Reed is a 51 y.o. male with a history of prediabetes, GERD with recent discontinuation of his Prilosec, presenting with abdominal pain, nausea and vomiting.  The patient states that he stopped taking his Prilosec 3 days ago, and today developed periumbilical and lower abdominal pain with associated nausea and vomiting.  He then took a Prilosec, and his symptoms improved.  His last bowel movement was this morning and it was normal.  He has not had fevers but he has been having chills.  He has no dysuria, hematuria, or urinary frequency.  Past Medical History:  Diagnosis Date  . Headache   . Pre-diabetes   . Rectal pain, chronic   . Shoulder pain     There are no active problems to display for this patient.   Past Surgical History:  Procedure Laterality Date  . SHOULDER SURGERY      Current Outpatient Rx  . Order #: [id number] Class: Historical Med  . Order #: [id number] Class: Historical Med  . Order #: [id number] Class: Print  . Order #: [id number] Class: Historical Med  . Order #: [id number] Class: Print  . Order #: [id number] Class: Print    Allergies Prednisone  No family history on file.  Social History Social History  Substance Use Topics  . Smoking status: Never Smoker  . Smokeless tobacco: Never Used  . Alcohol use No    Review of Systems Constitutional: No fever/chills.  No lightheadedness or syncope. Eyes: No visual changes. ENT: No sore throat. No congestion or rhinorrhea. Cardiovascular: Denies chest pain. Denies palpitations. Respiratory: Denies shortness of breath.  No cough. Gastrointestinal: Positive lower abdominal pain.  Positive nausea, positive vomiting.  No diarrhea.  No  constipation. Genitourinary: Negative for dysuria. Musculoskeletal: Negative for back pain. Skin: Negative for rash. Neurological: Negative for headaches. No focal numbness, tingling or weakness.     ____________________________________________   PHYSICAL EXAM:  VITAL SIGNS: ED Triage Vitals  Enc Vitals Group     BP 01/18/17 1835 121/74     Pulse Rate 01/18/17 1828 69     Resp 01/18/17 1828 18     Temp 01/18/17 1828 98.8 F (37.1 C)     Temp Source 01/18/17 1828 Oral     SpO2 01/18/17 1828 95 %     Weight --      Height --      Head Circumference --      Peak Flow --      Pain Score 01/18/17 1903 9     Pain Loc --      Pain Edu? --      Excl. in GC? --     Constitutional: Alert and oriented. Well appearing and in no acute distress. Answers questions appropriately. Eyes: Conjunctivae are normal.  EOMI. No scleral icterus. Head: Atraumatic. Nose: No congestion/rhinnorhea. Mouth/Throat: Mucous membranes are moist.  Neck: No stridor.  Supple.   Cardiovascular: Normal rate, regular rhythm. No murmurs, rubs or gallops.  Respiratory: Normal respiratory effort.  No accessory muscle use or retractions. Lungs CTAB.  No wheezes, rales or ronchi. Gastrointestinal: Soft, and nondistended.  Tender to palpation in the lower abdomen diffusely without focality.  No right upper quadrant pain; no Murphy sign.  No epigastric tenderness to palpation.  No guarding or rebound.  No peritoneal  signs. Musculoskeletal: No LE edema.  Neurologic:  A&Ox3.  Speech is clear.  Face and smile are symmetric.  EOMI.  Moves all extremities well. Skin:  Skin is warm, dry and intact. No rash noted. Psychiatric: Mood and affect are normal. Speech and behavior are normal.  Normal judgement.  ____________________________________________   LABS (all labs ordered are listed, but only abnormal results are displayed)  Labs Reviewed  COMPREHENSIVE METABOLIC PANEL - Abnormal; Notable for the following:        Result Value   Glucose, Bld 143 (*)    BUN 23 (*)    All other components within normal limits  URINALYSIS, COMPLETE (UACMP) WITH MICROSCOPIC - Abnormal; Notable for the following:    Color, Urine YELLOW (*)    APPearance CLEAR (*)    Ketones, ur 5 (*)    Protein, ur 30 (*)    All other components within normal limits  CBC  LIPASE, BLOOD   ____________________________________________  EKG  ED ECG REPORT I, Rockne Menghini, the attending physician, personally viewed and interpreted this ECG.   Date: 01/18/2017  EKG Time: 1949  Rate: 119  Rhythm: sinus tachycardia  Axis: normal  Intervals:none  ST&T Change: no STEMI  ____________________________________________  RADIOLOGY  Ct Abdomen Pelvis Wo Contrast  Result Date: 01/18/2017 CLINICAL DATA:  Initial evaluation for acute lower abdominal pain, vomiting. EXAM: CT ABDOMEN AND PELVIS WITHOUT CONTRAST TECHNIQUE: Multidetector CT imaging of the abdomen and pelvis was performed following the standard protocol without IV contrast. COMPARISON:  Prior ultrasound from 07/11/2016. FINDINGS: Lower chest: Mild subsegmental atelectasis present dependently within the visualized lung bases. Visualized lungs are otherwise clear. Hepatobiliary: Limited noncontrast evaluation of the liver is unremarkable. Gallbladder within normal limits. No biliary dilatation. Pancreas: Pancreas within normal limits. Spleen: Spleen within normal limits. Adrenals/Urinary Tract: Adrenal glands are within normal limits. Kidneys equal in size without evidence for nephrolithiasis or hydronephrosis. No hydroureter. Bladder within normal limits. Stomach/Bowel: Stomach within normal limits. No evidence for bowel obstruction. Appendix dilated up to 11 mm with associated periappendiceal inflammatory fat stranding, suspicious for acute appendicitis (series 2, image 61). No calcified appendicolith identified. No free air to suggest perforation or other complication. No other  acute inflammatory changes seen about the bowels. Vascular/Lymphatic: Intra-abdominal aorta of normal caliber. No adenopathy. Reproductive: Prostate normal. Other: Small volume free fluid within the pelvis, likely reactive. No free intraperitoneal air. Musculoskeletal: No acute osseous abnormality. No worrisome lytic or blastic osseous lesions. 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 IMPRESSION: 1. Findings consistent with acute appendicitis. No evidence for perforation or other complication identified on this noncontrast examination. 2. No other acute intra-abdominal or pelvic process identified. 3. 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 Electronically Signed   By: Rise Mu M.D.   On: 01/18/2017 20:47    ____________________________________________   PROCEDURES  Procedure(s) performed: None  Procedures  Critical Care performed: No ____________________________________________   INITIAL IMPRESSION / ASSESSMENT AND PLAN / ED COURSE  Pertinent labs & imaging results that were available during my care of the patient were reviewed by me and considered in my medical decision making (see chart for details).  51 y.o. male with a recent discontinuation of his Prilosec presenting with lower abdominal pain and nausea and vomiting.  Overall, the patient is hemodynamically stable and has a reassuring abdominal examination but with diffuse tenderness in the lower abdomen, will get a CT scan for further evaluation of etiologies including appendicitis or diverticulitis.  It is less likely that his symptoms are due to GERD given that he has no epigastric pain, but I do think he should restart this medication.  We will treat the patient symptomatically and reevaluate him for final disposition.  ----------------------------------------- 8:53 PM on 01/18/2017 -----------------------------------------  The patient is found to have  acute appendicitis without perforation or other complication.  I have explained this to the patient and his wife, Dr. Earlene Plateravis, the general surgery on-call, is on his way to talk to the patient about his admission and surgery.  At this time, the patient states that he has almost no pain.  Zosyn has been ordered.  ____________________________________________  FINAL CLINICAL IMPRESSION(S) / ED DIAGNOSES  Final diagnoses:  Other acute appendicitis  Sinus tachycardia         NEW MEDICATIONS STARTED DURING THIS VISIT:  New Prescriptions   No medications on file      Rockne MenghiniNorman, Anne-Caroline, MD 01/18/17 [phone]

## 2017-01-19 ENCOUNTER — Encounter: Payer: Self-pay | Admitting: Surgery

## 2017-01-19 DIAGNOSIS — K353 Acute appendicitis with localized peritonitis, without perforation or gangrene: Secondary | ICD-10-CM | POA: Diagnosis not present

## 2017-01-19 LAB — GLUCOSE, CAPILLARY: GLUCOSE-CAPILLARY: 149 mg/dL — AB (ref 65–99)

## 2017-01-19 MED ORDER — OXYCODONE-ACETAMINOPHEN 5-325 MG PO TABS
1.0000 | ORAL_TABLET | ORAL | Status: DC | PRN
  Administered 2017-01-19: 1 via ORAL
  Filled 2017-01-19 (×2): qty 1

## 2017-01-19 MED ORDER — KETOROLAC TROMETHAMINE 30 MG/ML IJ SOLN
INTRAMUSCULAR | Status: AC
  Filled 2017-01-19: qty 1

## 2017-01-19 MED ORDER — ACETAMINOPHEN 10 MG/ML IV SOLN
INTRAVENOUS | Status: DC | PRN
  Administered 2017-01-19: 1000 mg via INTRAVENOUS

## 2017-01-19 MED ORDER — FENTANYL CITRATE (PF) 100 MCG/2ML IJ SOLN
INTRAMUSCULAR | Status: AC
  Filled 2017-01-19: qty 2

## 2017-01-19 MED ORDER — ONDANSETRON HCL 4 MG/2ML IJ SOLN
INTRAMUSCULAR | Status: AC
  Filled 2017-01-19: qty 2

## 2017-01-19 MED ORDER — KETOROLAC TROMETHAMINE 30 MG/ML IJ SOLN
INTRAMUSCULAR | Status: DC | PRN
  Administered 2017-01-19: 30 mg via INTRAVENOUS

## 2017-01-19 MED ORDER — SUGAMMADEX SODIUM 200 MG/2ML IV SOLN
INTRAVENOUS | Status: DC | PRN
  Administered 2017-01-19: 170 mg via INTRAVENOUS

## 2017-01-19 MED ORDER — KCL IN DEXTROSE-NACL 20-5-0.45 MEQ/L-%-% IV SOLN
INTRAVENOUS | Status: DC
  Administered 2017-01-19 (×2): via INTRAVENOUS
  Filled 2017-01-19 (×4): qty 1000

## 2017-01-19 MED ORDER — SUCCINYLCHOLINE CHLORIDE 20 MG/ML IJ SOLN
INTRAMUSCULAR | Status: AC
  Filled 2017-01-19: qty 1

## 2017-01-19 MED ORDER — FENTANYL CITRATE (PF) 100 MCG/2ML IJ SOLN
INTRAMUSCULAR | Status: DC | PRN
  Administered 2017-01-18: 100 ug via INTRAVENOUS
  Administered 2017-01-19 (×2): 50 ug via INTRAVENOUS

## 2017-01-19 MED ORDER — ACETAMINOPHEN 650 MG RE SUPP: 650.0000 mg | Freq: Four times a day (QID) | RECTAL | Status: DC | PRN

## 2017-01-19 MED ORDER — ACETAMINOPHEN 325 MG PO TABS
650.0000 mg | ORAL_TABLET | Freq: Four times a day (QID) | ORAL | Status: DC | PRN
  Administered 2017-01-19 – 2017-01-20 (×2): 650 mg via ORAL
  Filled 2017-01-19 (×2): qty 2

## 2017-01-19 MED ORDER — OXYCODONE-ACETAMINOPHEN 5-325 MG PO TABS: 1.0000 | ORAL_TABLET | ORAL | 0 refills | Status: DC | PRN

## 2017-01-19 MED ORDER — ONDANSETRON HCL 4 MG/2ML IJ SOLN
4.0000 mg | Freq: Four times a day (QID) | INTRAMUSCULAR | Status: DC | PRN
  Administered 2017-01-19 (×2): 4 mg via INTRAVENOUS
  Filled 2017-01-19: qty 2

## 2017-01-19 MED ORDER — SUGAMMADEX SODIUM 200 MG/2ML IV SOLN
INTRAVENOUS | Status: AC
  Filled 2017-01-19: qty 2

## 2017-01-19 MED ORDER — ONDANSETRON HCL 4 MG/2ML IJ SOLN
INTRAMUSCULAR | Status: AC
  Administered 2017-01-19: 03:00:00
  Filled 2017-01-19: qty 2

## 2017-01-19 MED ORDER — LIDOCAINE HCL (PF) 2 % IJ SOLN
INTRAMUSCULAR | Status: AC
  Filled 2017-01-19: qty 10

## 2017-01-19 MED ORDER — MIDAZOLAM HCL 2 MG/2ML IJ SOLN
INTRAMUSCULAR | Status: DC | PRN
  Administered 2017-01-18: 2 mg via INTRAVENOUS

## 2017-01-19 MED ORDER — ROCURONIUM BROMIDE 50 MG/5ML IV SOLN
INTRAVENOUS | Status: AC
  Filled 2017-01-19: qty 1

## 2017-01-19 MED ORDER — DEXTROSE 5 % IV SOLN
2.0000 g | INTRAVENOUS | Status: DC
  Administered 2017-01-19 – 2017-01-20 (×2): 2 g via INTRAVENOUS
  Filled 2017-01-19 (×3): qty 2

## 2017-01-19 MED ORDER — METRONIDAZOLE IN NACL 5-0.79 MG/ML-% IV SOLN
500.0000 mg | Freq: Three times a day (TID) | INTRAVENOUS | Status: DC
  Administered 2017-01-19 – 2017-01-20 (×5): 500 mg via INTRAVENOUS
  Filled 2017-01-19 (×7): qty 100

## 2017-01-19 MED ORDER — ONDANSETRON HCL 4 MG/2ML IJ SOLN
INTRAMUSCULAR | Status: DC | PRN
  Administered 2017-01-19: 4 mg via INTRAVENOUS

## 2017-01-19 MED ORDER — ONDANSETRON 4 MG PO TBDP: 4.0000 mg | ORAL_TABLET | Freq: Four times a day (QID) | ORAL | Status: DC | PRN

## 2017-01-19 MED ORDER — MORPHINE SULFATE (PF) 4 MG/ML IV SOLN
2.0000 mg | INTRAVENOUS | Status: DC | PRN
  Administered 2017-01-19: 2 mg via INTRAVENOUS
  Filled 2017-01-19: qty 1

## 2017-01-19 MED ORDER — SUCCINYLCHOLINE CHLORIDE 20 MG/ML IJ SOLN
INTRAMUSCULAR | Status: DC | PRN
  Administered 2017-01-18: 100 mg via INTRAVENOUS

## 2017-01-19 MED ORDER — FENTANYL CITRATE (PF) 100 MCG/2ML IJ SOLN: 25.0000 ug | INTRAMUSCULAR | Status: DC | PRN

## 2017-01-19 MED ORDER — BUPIVACAINE HCL 0.5 % IJ SOLN
INTRAMUSCULAR | Status: DC | PRN
  Administered 2017-01-19: 15 mL via INTRAMUSCULAR

## 2017-01-19 MED ORDER — ROCURONIUM BROMIDE 100 MG/10ML IV SOLN
INTRAVENOUS | Status: DC | PRN
  Administered 2017-01-18: 10 mg via INTRAVENOUS
  Administered 2017-01-19: 40 mg via INTRAVENOUS

## 2017-01-19 MED ORDER — LACTATED RINGERS IV BOLUS (SEPSIS)
1000.0000 mL | Freq: Once | INTRAVENOUS | Status: AC
  Administered 2017-01-19: 1000 mL via INTRAVENOUS

## 2017-01-19 MED ORDER — KETOROLAC TROMETHAMINE 30 MG/ML IJ SOLN
30.0000 mg | Freq: Four times a day (QID) | INTRAMUSCULAR | Status: DC
  Administered 2017-01-19 – 2017-01-20 (×4): 30 mg via INTRAVENOUS
  Filled 2017-01-19 (×4): qty 1

## 2017-01-19 MED ORDER — PROPOFOL 10 MG/ML IV BOLUS
INTRAVENOUS | Status: DC | PRN
  Administered 2017-01-18: 150 mg via INTRAVENOUS
  Administered 2017-01-19: 50 mg via INTRAVENOUS

## 2017-01-19 MED ORDER — PANTOPRAZOLE SODIUM 40 MG PO TBEC
40.0000 mg | DELAYED_RELEASE_TABLET | Freq: Every day | ORAL | Status: DC
  Administered 2017-01-19 – 2017-01-20 (×2): 40 mg via ORAL
  Filled 2017-01-19 (×2): qty 1

## 2017-01-19 MED ORDER — SODIUM CHLORIDE 0.9 % IR SOLN
Status: DC | PRN
  Administered 2017-01-19: 1000 mL

## 2017-01-19 MED ORDER — LACTATED RINGERS IV SOLN
INTRAVENOUS | Status: DC | PRN
  Administered 2017-01-18: via INTRAVENOUS

## 2017-01-19 MED ORDER — ONDANSETRON HCL 4 MG/2ML IJ SOLN: 4.0000 mg | Freq: Once | INTRAMUSCULAR | Status: DC | PRN

## 2017-01-19 MED ORDER — LIDOCAINE HCL (CARDIAC) 20 MG/ML IV SOLN
INTRAVENOUS | Status: DC | PRN
  Administered 2017-01-18: 100 mg via INTRAVENOUS

## 2017-01-19 MED ORDER — ACETAMINOPHEN 10 MG/ML IV SOLN
INTRAVENOUS | Status: AC
  Filled 2017-01-19: qty 100

## 2017-01-19 NOTE — Progress Notes (Signed)
Episode occurred where pt was heard vomiting from the hallway by Ree KidaJack, RN who entered into pts room to find him actively and loudly and harshly vomiting.  Per Ree KidaJack, RN he asked the pt if he was okay and the pt became unresponsive and passed out.  Ree KidaJack, RN sternal rubbed pt and pt was unresponsive.  Ree KidaJack, RN then called out for help and myself, charge nurse and nurse aide entered into pts room and began to assist with waking pt.  VS were obtained (See MAR) and stable, not hypotensive, BG was checked and pt was not hypoglycemic.  Approximally 3-4 minutes after arriving into pts room pt woke up and was able to quickly respond to commands.  Interpreter was called and pt stated to the interpreter that he was throwing up and they he doesn't remember what happened.    With continued use of the interpreter, Pt remains AOx4 but continues to have N/V.  Pt declined any additional needs and wanted to eat his Jell-o.    Dr. Everlene FarrierPabon notified.  No new orders given.  Will continue to monitor pt and notify MD of any further changes.

## 2017-01-19 NOTE — Transfer of Care (Signed)
Immediate Anesthesia Transfer of Care Note  Patient: Glenn Reed  Procedure(s) Performed: APPENDECTOMY LAPAROSCOPIC (N/A Abdomen)  Patient Location: PACU  Anesthesia Type:General  Level of Consciousness: sedated  Airway & Oxygen Therapy: Patient Spontanous Breathing and Patient connected to face mask oxygen  Post-op Assessment: Report given to RN and Post -op Vital signs reviewed and stable  Post vital signs: Reviewed and stable  Last Vitals:  Vitals:   01/18/17 2245 01/18/17 2300  BP: 133/74 131/72  Pulse: 98 (!) 104  Resp: (!) 22 (!) 22  Temp:    SpO2: 96% 96%    Last Pain:  Vitals:   01/18/17 2329  TempSrc:   PainSc: 3          Complications: No apparent anesthesia complications

## 2017-01-19 NOTE — Progress Notes (Signed)
Notified Dr Earlene Plateravis that patient had two episodes of green liquid emesis. No further orders given. Will continue to monitor patient.

## 2017-01-19 NOTE — Anesthesia Postprocedure Evaluation (Signed)
Anesthesia Post Note  Patient: Scientist, [name]  Procedure(s) Performed: APPENDECTOMY LAPAROSCOPIC (N/A Abdomen)  Patient location during evaluation: PACU Anesthesia Type: General Level of consciousness: awake and alert Pain management: pain level controlled Vital Signs Assessment: post-procedure vital signs reviewed and stable Respiratory status: spontaneous breathing, nonlabored ventilation, respiratory function stable and patient connected to nasal cannula oxygen Cardiovascular status: blood pressure returned to baseline and stable Postop Assessment: no apparent nausea or vomiting Anesthetic complications: no     Last Vitals:  Vitals:   01/19/17 0227 01/19/17 0303  BP: 108/64 (!) 98/59  Pulse: 80 86  Resp: 20 18  Temp: 37.9 C 37.7 C  SpO2: 95% 94%    Last Pain:  Vitals:   01/19/17 0303  TempSrc: Oral  PainSc:                  Alyssia Heese S

## 2017-01-19 NOTE — Op Note (Signed)
SURGICAL OPERATIVE REPORT  DATE OF PROCEDURE: 01/19/2017  ATTENDING Surgeon(s): Ancil Linseyavis, Habib Kise Evan, MD  ANESTHESIA: GETA (General)  PRE-OPERATIVE DIAGNOSIS: Acute non-perforated appendicitis with localized peritonitis (K35.3)  POST-OPERATIVE DIAGNOSIS: Acute non-perforated appendicitis with localized peritonitis (K35.3)  PROCEDURE(S):  1.) Laparoscopic appendectomy (cpt: 44970)  INTRAOPERATIVE FINDINGS: Moderately inflamed non-perforated retrocecal appendix surrounded by murky ascites and reactive enteritis/ileitis without appreciable cecal colitis  INTRAVENOUS FLUIDS: 600 mL crystalloid   ESTIMATED BLOOD LOSS: Minimal (<20 mL)  URINE OUTPUT: 350 mL   SPECIMENS: Appendix  IMPLANTS: None  DRAINS: None  COMPLICATIONS: None apparent  CONDITION AT END OF PROCEDURE: Hemodynamically stable and extubated  DISPOSITION OF PATIENT: PACU  INDICATIONS FOR PROCEDURE:  Patient is a 51 y.o. otherwise reportedly healthy male who presented with acute onset of epigastric abdominal pain that then progressed to become greatest at the Right lower quadrant. Patient also reports nausea and vomiting without change in bowel habits, fever/chills, CP, or SOB and reported the pain has been well-controlled in the Emergency Department. All risks, benefits, and alternatives to appendectomy were discussed with the patient, all of patient's questions were answered to his expressed satisfaction, and informed consent was obtained and documented.  DETAILS OF PROCEDURE: Patient was brought to the operating suite and appropriately identified. General anesthesia was administered along with confirmation of appropriate pre-operative antibiotics, and endotracheal intubation was performed by anesthetist, along with NG/OG tube for gastric decompression. In supine position, operative site was prepped and draped in usual sterile fashion, and following a brief time out, initial 5 mm incision was made in a natural skin  crease just below the umbilicus. Fascia was then elevated, and a Verress needle was inserted and its proper position confirmed using saline meniscus test prior to abdominal insufflation.  Upon insufflation of the abdominal cavity with carbon dioxide to a well-tolerated pressure of 12-15 mmHg, a 5 mm peri-umbilical port followed by laparoscope were inserted and used to inspect the abdominal cavity and its contents with no injuries from insertion of the first trochar noted. Two additional trocars were inserted, a 12 mm port at the Left lower quadrant position and another 5 mm port at the suprapubic position. The table was then placed in Trendelenburg position with the Right side up, and blunt graspers were gently used to retract the bowel overlying a clearly inflamed appendix surrounded by moderate peri-appendiceal inflammation and murky ascites. The appendix was gently retracted by near its tip, and the base of the appendix and mesoappendix were identified in relation to the cecum. The mesoappendix was dissected from the visceral appendix and hemostasis achieved using a harmonic scalpel. Upon freeing the visceral appendix from the mesoappendix, an endostapler loaded with a standard blue tissue load was advanced across the base of the visceral appendix, which was compressed for several seconds, and the stapler was deployed and removed from the abdominal cavity. Hemostasis was confirmed, and the specimen was extracted from the abdominal cavity in a laparoscopic specimen bag.  The intraperitoneal cavity was inspected with no additional findings. PMI laparoscopic fascial closure device was then used to re-approximate fascia at the 12 mm Left lower quadrant port site. All ports were then removed under direct visualization, and the abdominal cavity was desuflated. All port sites were irrigated/cleaned, additional local anesthetic was injected at each incision, 3-0 Vicryl was used to re-approximate dermis at 10/12 mm  port site(s), and subcuticular 4-0 Monocryl suture was used to re-approximate skin. Skin was then cleaned, dried, and sterile skin glue was applied. Patient was then  safely able to be awakened, extubated, and transferred to PACU for post-operative monitoring and care.   I was present for all aspects of procedure, and there were no intra-operative complications apparent.

## 2017-01-19 NOTE — Anesthesia Post-op Follow-up Note (Signed)
Anesthesia QCDR form completed.        

## 2017-01-19 NOTE — Progress Notes (Signed)
POD # 0 Still nauseated, some emesis this am AVSS  PE NAD Abd: soft, incision c/d/i  A/P We will keep him today since he still nauseated. On clears. If he improves DC in am

## 2017-01-19 NOTE — Anesthesia Procedure Notes (Signed)
Procedure Name: Intubation Date/Time: 01/19/2017 12:26 AM Performed by: Nelda Marseille Pre-anesthesia Checklist: Patient identified, Patient being monitored, Timeout performed, Emergency Drugs available and Suction available Patient Re-evaluated:Patient Re-evaluated prior to induction Oxygen Delivery Method: Circle system utilized Preoxygenation: Pre-oxygenation with 100% oxygen Induction Type: IV induction Ventilation: Mask ventilation without difficulty Laryngoscope Size: Mac and 3 Grade View: Grade I Tube type: Oral Tube size: 7.5 mm Number of attempts: 1 Airway Equipment and Method: Stylet Placement Confirmation: ETT inserted through vocal cords under direct vision,  positive ETCO2 and breath sounds checked- equal and bilateral Secured at: 21 cm Tube secured with: Tape Dental Injury: Teeth and Oropharynx as per pre-operative assessment

## 2017-01-20 NOTE — Care Management (Signed)
Patient to discharge home today.  Status post lap appy.  Patient self pay.  PCP CAREW, Rande LawmanKHARY.  Application given for Medication Management  And ODC as additional resource.  Patient to discharge with pain medication.  RNCM signing off.

## 2017-01-20 NOTE — Progress Notes (Signed)
Pt to be d/c today.   Went to his room and about to removed IV, but pt. States " my eyes hurt and I feel hot. Took his V/Sat 11:51 am: Temp 100.7, BP 112/60, HR 109, RR 27, and O2 94. MD was notified and asked if he want to proceed on the discharged and MD said to proceed.

## 2017-01-20 NOTE — Discharge Summary (Signed)
Physician Discharge Summary  Patient ID: Glenn Reed MRN: [id number] DOB/AGE: 1966/02/15 51 y.o.  Admit date: 01/18/2017 Discharge date: 01/20/2017  Admission Diagnoses:  Discharge Diagnoses:  Active Problems:   Acute appendicitis with localized peritonitis   Discharged Condition: good  Hospital Course: 51 y.o. male [facility] ED for abdominal pain. Workup was found to be significant for CT imaging demonstrating acute appendicitis. Informed consent was obtained and documented, and patient underwent uneventful laparoscopic appendectomy Earlene Plater(Glenn Reed, 01/19/2017).  Post-operatively, patient's pain improved/resolved and advancement of patient's diet and ambulation were well-tolerated. On post-operative day #1, patient complained of nausea and a single episode of emesis early in the day, for which he remained until post-operative day #2. The remainder of patient's hospital course was essentially unremarkable, and discharge planning was initiated accordingly with patient safely able to be discharged home with appropriate discharge instructions, pain control, and outpatient surgical follow-up after all of his and family's questions were answered to their expressed satisfaction.  Consults: None  Significant Diagnostic Studies: radiology: CT scan: acute appendicitis  Treatments: IV hydration, antibiotics: ceftriaxone and metronidazole and surgery: laparoscopic appendectomy  Discharge Exam: Blood pressure 112/60, pulse (!) 109, temperature (!) 100.7 F (38.2 C), temperature source Oral, resp. rate (!) 27, height 5\' 7"  (1.702 m), weight 204 lb (92.5 kg), SpO2 94 %. General appearance: alert, cooperative and no distress GI: abdomen soft and non-distended with mild peri-incisional tenderness to palpation, incisions well-approximated with no peri-incisional erythema or drainage  Disposition: 01-Home or Self Care   Allergies as of 01/20/2017      Reactions   Prednisone Other (See  Comments)   psychosis      Medication List    TAKE these medications   omeprazole 20 MG capsule Commonly known as:  PRILOSEC Take 20 mg by mouth daily as needed (acid reflux).   oxyCODONE-acetaminophen 5-325 MG tablet Commonly known as:  ROXICET Take 1-2 tablets by mouth every 4 (four) hours as needed for severe pain.      Follow-up Information    Ancil Linseyavis, Athziri Freundlich Evan, MD. Go on 01/31/2017.   Specialty:  General Surgery Why:  Friday at 8:45am for hospital follow-up Contact information: [address] [place] [address] [phone]           Signed: Ancil LinseyJason Evan Ithiel Liebler 01/20/2017, 12:18 PM

## 2017-01-21 ENCOUNTER — Telehealth: Payer: Self-pay

## 2017-01-21 LAB — SURGICAL PATHOLOGY

## 2017-01-21 NOTE — Telephone Encounter (Deleted)
Post-op call made to patient at this time. Spoke with __________. Post-op interview questions below.  1. How are you feeling? ___________  2. Is your pain controlled? ____________  3. What are you doing for the pain? ____________  4. Are you having any Nausea or Vomiting? ______________  5. Are you having any Fever or Chills? ______________  6. Are you having any Constipation or Diarrhea? _____________  7. Is there any Swelling or Bruising you are concerned about? ________________  8. Do you have any questions or concerns at this time? _______________   Discussion: ____________________________  

## 2017-01-22 NOTE — Telephone Encounter (Signed)
Called patient and left him a voicemail to return my call.

## 2017-01-23 ENCOUNTER — Other Ambulatory Visit
Admission: RE | Admit: 2017-01-23 | Discharge: 2017-01-23 | Disposition: A | Discharge status: Home / Self Care | Payer: BLUE CROSS/BLUE SHIELD | Source: Ambulatory Visit | Attending: Surgery | Admitting: Surgery

## 2017-01-23 ENCOUNTER — Ambulatory Visit (INDEPENDENT_AMBULATORY_CARE_PROVIDER_SITE_OTHER): Payer: BLUE CROSS/BLUE SHIELD | Admitting: Surgery

## 2017-01-23 ENCOUNTER — Inpatient Hospital Stay
Admission: AD | Admit: 2017-01-23 | Discharge: 2017-01-25 | DRG: 909 | Disposition: A | Discharge status: Home / Self Care | Payer: BLUE CROSS/BLUE SHIELD | Source: Ambulatory Visit | Attending: Surgery | Admitting: Surgery

## 2017-01-23 ENCOUNTER — Encounter: Payer: Self-pay | Admitting: Surgery

## 2017-01-23 ENCOUNTER — Telehealth: Payer: Self-pay

## 2017-01-23 ENCOUNTER — Ambulatory Visit
Admission: RE | Admit: 2017-01-23 | Discharge: 2017-01-23 | Disposition: A | Discharge status: Home / Self Care | Payer: BLUE CROSS/BLUE SHIELD | Source: Ambulatory Visit | Attending: Surgery | Admitting: Surgery

## 2017-01-23 VITALS — BP 144/82 | HR 106 | Temp 98.1°F | Wt 199.0 lb

## 2017-01-23 DIAGNOSIS — R1084 Generalized abdominal pain: Secondary | ICD-10-CM | POA: Diagnosis present

## 2017-01-23 DIAGNOSIS — K91870 Postprocedural hematoma of a digestive system organ or structure following a digestive system procedure: Principal | ICD-10-CM | POA: Diagnosis present

## 2017-01-23 DIAGNOSIS — R197 Diarrhea, unspecified: Secondary | ICD-10-CM

## 2017-01-23 DIAGNOSIS — K661 Hemoperitoneum: Secondary | ICD-10-CM | POA: Diagnosis not present

## 2017-01-23 DIAGNOSIS — Z9049 Acquired absence of other specified parts of digestive tract: Secondary | ICD-10-CM | POA: Diagnosis present

## 2017-01-23 DIAGNOSIS — R109 Unspecified abdominal pain: Secondary | ICD-10-CM | POA: Diagnosis present

## 2017-01-23 DIAGNOSIS — R1030 Lower abdominal pain, unspecified: Secondary | ICD-10-CM

## 2017-01-23 LAB — C DIFFICILE QUICK SCREEN W PCR REFLEX
C DIFFICILE (CDIFF) INTERP: NOT DETECTED
C DIFFICILE (CDIFF) TOXIN: NEGATIVE
C DIFFICLE (CDIFF) ANTIGEN: NEGATIVE

## 2017-01-23 LAB — CBC WITH DIFFERENTIAL/PLATELET
Basophils Absolute: 0 10*3/uL (ref 0–0.1)
Basophils Relative: 0 %
EOS ABS: 0.1 10*3/uL (ref 0–0.7)
EOS PCT: 1 %
HCT: 32.9 % — ABNORMAL LOW (ref 40.0–52.0)
HEMOGLOBIN: 11.4 g/dL — AB (ref 13.0–18.0)
LYMPHS ABS: 0.8 10*3/uL — AB (ref 1.0–3.6)
LYMPHS PCT: 11 %
MCH: 29.9 pg (ref 26.0–34.0)
MCHC: 34.6 g/dL (ref 32.0–36.0)
MCV: 86.5 fL (ref 80.0–100.0)
MONOS PCT: 10 %
Monocytes Absolute: 0.8 10*3/uL (ref 0.2–1.0)
Neutro Abs: 6.1 10*3/uL (ref 1.4–6.5)
Neutrophils Relative %: 78 %
PLATELETS: 255 10*3/uL (ref 150–440)
RBC: 3.81 MIL/uL — AB (ref 4.40–5.90)
RDW: 12.8 % (ref 11.5–14.5)
WBC: 7.8 10*3/uL (ref 3.8–10.6)

## 2017-01-23 LAB — COMPREHENSIVE METABOLIC PANEL
ALT: 35 U/L (ref 17–63)
ANION GAP: 9 (ref 5–15)
AST: 22 U/L (ref 15–41)
Albumin: 3.4 g/dL — ABNORMAL LOW (ref 3.5–5.0)
Alkaline Phosphatase: 48 U/L (ref 38–126)
BUN: 14 mg/dL (ref 6–20)
CALCIUM: 8.8 mg/dL — AB (ref 8.9–10.3)
CO2: 28 mmol/L (ref 22–32)
CREATININE: 0.81 mg/dL (ref 0.61–1.24)
Chloride: 98 mmol/L — ABNORMAL LOW (ref 101–111)
Glucose, Bld: 178 mg/dL — ABNORMAL HIGH (ref 65–99)
Potassium: 3.6 mmol/L (ref 3.5–5.1)
SODIUM: 135 mmol/L (ref 135–145)
TOTAL PROTEIN: 7.2 g/dL (ref 6.5–8.1)
Total Bilirubin: 1.3 mg/dL — ABNORMAL HIGH (ref 0.3–1.2)

## 2017-01-23 MED ORDER — ONDANSETRON 4 MG PO TBDP: 4.0000 mg | ORAL_TABLET | Freq: Four times a day (QID) | ORAL | Status: DC | PRN

## 2017-01-23 MED ORDER — PANTOPRAZOLE SODIUM 40 MG IV SOLR
40.0000 mg | Freq: Every day | INTRAVENOUS | Status: DC
  Administered 2017-01-23 – 2017-01-24 (×2): 40 mg via INTRAVENOUS
  Filled 2017-01-23 (×2): qty 40

## 2017-01-23 MED ORDER — MORPHINE SULFATE (PF) 2 MG/ML IV SOLN: 2.0000 mg | INTRAVENOUS | Status: DC | PRN

## 2017-01-23 MED ORDER — CHLORHEXIDINE GLUCONATE CLOTH 2 % EX PADS
6.0000 | MEDICATED_PAD | Freq: Once | CUTANEOUS | Status: AC
  Administered 2017-01-23: 6 via TOPICAL

## 2017-01-23 MED ORDER — PIPERACILLIN-TAZOBACTAM 3.375 G IVPB
3.3750 g | Freq: Three times a day (TID) | INTRAVENOUS | Status: DC
  Administered 2017-01-23 – 2017-01-25 (×6): 3.375 g via INTRAVENOUS
  Filled 2017-01-23 (×6): qty 50

## 2017-01-23 MED ORDER — ACETAMINOPHEN 500 MG PO TABS
1000.0000 mg | ORAL_TABLET | Freq: Four times a day (QID) | ORAL | Status: DC | PRN
  Administered 2017-01-23 – 2017-01-24 (×2): 1000 mg via ORAL
  Filled 2017-01-23: qty 2

## 2017-01-23 MED ORDER — IOPAMIDOL (ISOVUE-300) INJECTION 61%
100.0000 mL | Freq: Once | INTRAVENOUS | Status: AC | PRN
  Administered 2017-01-23: 100 mL via INTRAVENOUS

## 2017-01-23 MED ORDER — LACTATED RINGERS IV SOLN
INTRAVENOUS | Status: DC
  Administered 2017-01-23 – 2017-01-24 (×4): via INTRAVENOUS

## 2017-01-23 MED ORDER — ACETAMINOPHEN 500 MG PO TABS
ORAL_TABLET | ORAL | Status: AC
  Filled 2017-01-23: qty 2

## 2017-01-23 MED ORDER — HEPARIN SODIUM (PORCINE) 5000 UNIT/ML IJ SOLN
5000.0000 [IU] | Freq: Three times a day (TID) | INTRAMUSCULAR | Status: DC
  Administered 2017-01-23 – 2017-01-25 (×4): 5000 [IU] via SUBCUTANEOUS
  Filled 2017-01-23 (×4): qty 1

## 2017-01-23 MED ORDER — ONDANSETRON HCL 4 MG/2ML IJ SOLN
4.0000 mg | Freq: Four times a day (QID) | INTRAMUSCULAR | Status: DC | PRN
  Administered 2017-01-24: 4 mg via INTRAVENOUS

## 2017-01-23 NOTE — Progress Notes (Signed)
Outpatient Surgical Follow Up  01/23/2017  Glenn Reed is an 51 y.o. male.   Chief Complaint  Patient presents with  . Post-op Problem    HPI: s/p lap appy by Dr. Rosana Hoes  10/20./ No w with fevers 101.4, nausea, diarrhea and increase abdominal pain. He went home and felt better for a short period of time and now is worst.  CT scan p. Reviewed w radiologist. Collection, free fluid pelvis, contrast extrav either from stump leak or retained appendix.Marland Kitchen No free air  Past Medical History:  Diagnosis Date  . Headache   . Pre-diabetes   . Rectal pain, chronic   . Shoulder pain     Past Surgical History:  Procedure Laterality Date  . LAPAROSCOPIC APPENDECTOMY N/A 01/18/2017   Procedure: APPENDECTOMY LAPAROSCOPIC;  Surgeon: Vickie Epley, MD;  Location: [facility] ORS;  Service: General;  Laterality: N/A;  . SHOULDER SURGERY      No family history on file.  Social History:  reports that he has never smoked. He has never used smokeless tobacco. He reports that he does not drink alcohol or use drugs.  Allergies:  Allergies  Allergen Reactions  . Prednisone Other (See Comments)    psychosis    Medications reviewed.    ROS Full ROS performed and is otherwise negative other than what is stated in HPI   There were no vitals taken for this visit.  Physical Exam  Constitutional: He is oriented to person, place, and time and well-developed, well-nourished, and in no distress.  Pulmonary/Chest: Effort normal. No respiratory distress.  Abdominal: He exhibits distension. There is tenderness.  Incision c.d/i. Diffuse tenderness, no overt peritonitis.  Neurological: He is alert and oriented to person, place, and time. Gait normal. GCS score is 15.  Skin: Skin is warm and dry.  Psychiatric: Mood, memory, affect and judgment normal.  Nursing note and vitals reviewed.      Results for orders placed or performed during the hospital encounter of 01/23/17 (from the past 48  hour(s))  CBC with Differential     Status: Abnormal   Collection Time: 01/23/17  2:03 PM  Result Value Ref Range   WBC 7.8 3.8 - 10.6 K/uL   RBC 3.81 (L) 4.40 - 5.90 MIL/uL   Hemoglobin 11.4 (L) 13.0 - 18.0 g/dL   HCT 32.9 (L) 40.0 - 52.0 %   MCV 86.5 80.0 - 100.0 fL   MCH 29.9 26.0 - 34.0 pg   MCHC 34.6 32.0 - 36.0 g/dL   RDW 12.8 11.5 - 14.5 %   Platelets 255 150 - 440 K/uL   Neutrophils Relative % 78 %   Neutro Abs 6.1 1.4 - 6.5 K/uL   Lymphocytes Relative 11 %   Lymphs Abs 0.8 (L) 1.0 - 3.6 K/uL   Monocytes Relative 10 %   Monocytes Absolute 0.8 0.2 - 1.0 K/uL   Eosinophils Relative 1 %   Eosinophils Absolute 0.1 0 - 0.7 K/uL   Basophils Relative 0 %   Basophils Absolute 0.0 0 - 0.1 K/uL  Comprehensive metabolic panel     Status: Abnormal   Collection Time: 01/23/17  2:03 PM  Result Value Ref Range   Sodium 135 135 - 145 mmol/L   Potassium 3.6 3.5 - 5.1 mmol/L   Chloride 98 (L) 101 - 111 mmol/L   CO2 28 22 - 32 mmol/L   Glucose, Bld 178 (H) 65 - 99 mg/dL   BUN 14 6 - 20 mg/dL   Creatinine, Ser  0.81 0.61 - 1.24 mg/dL   Calcium 8.8 (L) 8.9 - 10.3 mg/dL   Total Protein 7.2 6.5 - 8.1 g/dL   Albumin 3.4 (L) 3.5 - 5.0 g/dL   AST 22 15 - 41 U/L   ALT 35 17 - 63 U/L   Alkaline Phosphatase 48 38 - 126 U/L   Total Bilirubin 1.3 (H) 0.3 - 1.2 mg/dL   GFR calc non Af Amer >60 >60 mL/min   GFR calc Af Amer >60 >60 mL/min    Comment: (NOTE) The eGFR has been calculated using the CKD EPI equation. This calculation has not been validated in all clinical situations. eGFR's persistently <60 mL/min signify possible Chronic Kidney Disease.    Anion gap 9 5 - 15  C difficile quick screen w PCR reflex     Status: None   Collection Time: 01/23/17  2:05 PM  Result Value Ref Range   C Diff antigen NEGATIVE NEGATIVE   C Diff toxin NEGATIVE NEGATIVE   C Diff interpretation No C. difficile detected.    Ct Abdomen Pelvis W Contrast  Result Date: 01/23/2017 CLINICAL DATA:  Patient  had appendectomy on January 18, 2017. The patient complains of epigastric pain, fever and chills since surgery. EXAM: CT ABDOMEN AND PELVIS WITH CONTRAST TECHNIQUE: Multidetector CT imaging of the abdomen and pelvis was performed using the standard protocol following bolus administration of intravenous contrast. CONTRAST:  176m ISOVUE-300 IOPAMIDOL (ISOVUE-300) INJECTION 61% COMPARISON:  January 18, 2017 FINDINGS: Lower chest: There are minimal bilateral pleural effusions with atelectasis of bilateral posterior lung bases. The heart size is normal. Hepatobiliary: There is diffuse low density of the liver without focal liver lesion or vessel displacement. The gallbladder and biliary tree are normal. Pancreas: Unremarkable. No pancreatic ductal dilatation or surrounding inflammatory changes. Spleen: Normal in size without focal abnormality. Adrenals/Urinary Tract: Adrenal glands are unremarkable. Kidneys are normal, without renal calculi, focal lesion, or hydronephrosis. Bladder is unremarkable. Stomach/Bowel: In the right lower quadrant in expected location of the appendix, there is 3 cm length tubular shaped contrast extending from the cecum. This is maybe due to residual appendix with contrast in place but extravasation of contrast from the cecum is not excluded. There is a 4.3 x 3.2 cm masslike lesion posterior to the terminal ileum and anterior to the linear tubular shaped contrast extending from the cecum. This masslike area head slightly higher density dense simple fluid. This may represent a focal hematoma, but abscess is not excluded. Moderate amount of high density fluid is identified throughout the pelvis, question blood/hemorrhage. There are mild dilated small bowel loops with mild thickened wall in the abdomen probably due to postoperative small bowel ileus. The stomach is normal. Vascular/Lymphatic: No significant vascular findings are present. No enlarged abdominal or pelvic lymph nodes. Reproductive:  Prostate is unremarkable. Other: None. 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 No acute abnormality noted. IMPRESSION: In the right lower quadrant in expected location of the appendix, there is 3 cm length tubular shaped contrast extending from the cecum. This is maybe due to residual appendix with contrast in place but extravasation of contrast from the cecum is not excluded. There is a 4.3 x 3.2 cm masslike lesion posterior to the terminal ileum and anterior to the linear tubular shaped contrast extending from the cecum. This masslike area head slightly higher density dense simple fluid. This may represent a focal hematoma, but abscess is not excluded. Moderate amount of high density fluid is identified throughout the pelvis,  question blood/hemorrhage. Postoperative small bowel ileus. Electronically Signed   By: Abelardo Diesel M.D.   On: 01/23/2017 15:51    Assessment/Plan:  1. Lower abdominal pain, fevers nausea nad diarrhea. HE certainly has not recovered as he should from an uneventful appendectomy. HE either has a leak from the stump or a retained appendix. Fluid collection not amenable for IR drainage. I d/w Dr. Rosana Hoes his operating surgeon. We will admit him to the hospital, keep NPO , IV A/Bs and potentially he will need a dx laparoscopy.   Caroleen Hamman, MD [facility] General Surgeon

## 2017-01-23 NOTE — Telephone Encounter (Signed)
Patient called and stated that ever since he left the hospital, he had been having a fever of the highest 101.4 F. He stated that he has taken Advil every 8 hours for his fever. He also stated that he feels gassy all the time even though he is able to pass gas. Patient also stated that he has had diarrhea with loose stools, going 4-5 times a day now for 4 days. Patient denied Abdominal pain, nausea and vomiting. I told patient that we would need to see him today to make sure that he did not have any possible infections. Patient agreed. He will be seen at 2:30 PM today.

## 2017-01-23 NOTE — Telephone Encounter (Signed)
Spoke with radiology tech and she informed me that the results of patients CT scan are in and verified that I could see the impression. I verbalized understanding and stated that I would get this information over to Dr. Everlene FarrierPabon.

## 2017-01-24 ENCOUNTER — Encounter
Admission: AD | Disposition: A | Discharge status: Home / Self Care | Payer: Self-pay | Source: Ambulatory Visit | Attending: Surgery

## 2017-01-24 ENCOUNTER — Encounter: Payer: Self-pay | Admitting: *Deleted

## 2017-01-24 ENCOUNTER — Inpatient Hospital Stay: Payer: BLUE CROSS/BLUE SHIELD | Admitting: Anesthesiology

## 2017-01-24 DIAGNOSIS — K661 Hemoperitoneum: Secondary | ICD-10-CM

## 2017-01-24 HISTORY — PX: LAPAROSCOPY: SHX197

## 2017-01-24 LAB — BASIC METABOLIC PANEL
ANION GAP: 4 — AB (ref 5–15)
BUN: 14 mg/dL (ref 6–20)
CALCIUM: 8.5 mg/dL — AB (ref 8.9–10.3)
CO2: 29 mmol/L (ref 22–32)
Chloride: 104 mmol/L (ref 101–111)
Creatinine, Ser: 0.94 mg/dL (ref 0.61–1.24)
GFR calc non Af Amer: >60 mL/min (ref >60)
Glucose, Bld: 133 mg/dL — ABNORMAL HIGH (ref 65–99)
Potassium: 3.7 mmol/L (ref 3.5–5.1)
SODIUM: 137 mmol/L (ref 135–145)

## 2017-01-24 LAB — CBC
HCT: 30.2 % — ABNORMAL LOW (ref 40.0–52.0)
HEMOGLOBIN: 10.6 g/dL — AB (ref 13.0–18.0)
MCH: 30.1 pg (ref 26.0–34.0)
MCHC: 35.1 g/dL (ref 32.0–36.0)
MCV: 85.8 fL (ref 80.0–100.0)
PLATELETS: 247 10*3/uL (ref 150–440)
RBC: 3.52 MIL/uL — AB (ref 4.40–5.90)
RDW: 13 % (ref 11.5–14.5)
WBC: 8.8 10*3/uL (ref 3.8–10.6)

## 2017-01-24 SURGERY — LAPAROSCOPY, DIAGNOSTIC
Anesthesia: General

## 2017-01-24 MED ORDER — MIDAZOLAM HCL 2 MG/2ML IJ SOLN
INTRAMUSCULAR | Status: DC | PRN
  Administered 2017-01-24 (×2): 1 mg via INTRAVENOUS

## 2017-01-24 MED ORDER — ACETAMINOPHEN 325 MG PO TABS: 650.0000 mg | ORAL_TABLET | Freq: Four times a day (QID) | ORAL | Status: DC | PRN

## 2017-01-24 MED ORDER — CHLORHEXIDINE GLUCONATE CLOTH 2 % EX PADS: 6.0000 | MEDICATED_PAD | Freq: Once | CUTANEOUS | Status: DC

## 2017-01-24 MED ORDER — DEXAMETHASONE SODIUM PHOSPHATE 10 MG/ML IJ SOLN
INTRAMUSCULAR | Status: DC | PRN
  Administered 2017-01-24: 10 mg via INTRAVENOUS

## 2017-01-24 MED ORDER — FENTANYL CITRATE (PF) 100 MCG/2ML IJ SOLN
INTRAMUSCULAR | Status: DC | PRN
  Administered 2017-01-24 (×2): 50 ug via INTRAVENOUS

## 2017-01-24 MED ORDER — SUGAMMADEX SODIUM 500 MG/5ML IV SOLN
INTRAVENOUS | Status: DC | PRN
  Administered 2017-01-24: 200 mg via INTRAVENOUS

## 2017-01-24 MED ORDER — LIDOCAINE HCL (PF) 2 % IJ SOLN
INTRAMUSCULAR | Status: AC
  Filled 2017-01-24: qty 10

## 2017-01-24 MED ORDER — LIDOCAINE HCL 1 % IJ SOLN
INTRAMUSCULAR | Status: DC | PRN
  Administered 2017-01-24: 10 mL via INTRAMUSCULAR

## 2017-01-24 MED ORDER — ONDANSETRON HCL 4 MG/2ML IJ SOLN
INTRAMUSCULAR | Status: AC
  Filled 2017-01-24: qty 2

## 2017-01-24 MED ORDER — PHENYLEPHRINE HCL 10 MG/ML IJ SOLN
INTRAMUSCULAR | Status: DC | PRN
  Administered 2017-01-24 (×4): 100 ug via INTRAVENOUS

## 2017-01-24 MED ORDER — ACETAMINOPHEN 650 MG RE SUPP: 650.0000 mg | Freq: Four times a day (QID) | RECTAL | Status: DC | PRN

## 2017-01-24 MED ORDER — OXYCODONE-ACETAMINOPHEN 5-325 MG PO TABS
1.0000 | ORAL_TABLET | ORAL | Status: DC | PRN
  Administered 2017-01-24 – 2017-01-25 (×3): 1 via ORAL
  Filled 2017-01-24 (×2): qty 1
  Filled 2017-01-24: qty 2

## 2017-01-24 MED ORDER — FENTANYL CITRATE (PF) 100 MCG/2ML IJ SOLN
INTRAMUSCULAR | Status: AC
  Administered 2017-01-24: 25 ug via INTRAVENOUS
  Filled 2017-01-24: qty 2

## 2017-01-24 MED ORDER — OXYCODONE HCL 5 MG/5ML PO SOLN: 5.0000 mg | Freq: Once | ORAL | Status: DC | PRN

## 2017-01-24 MED ORDER — MORPHINE SULFATE (PF) 2 MG/ML IV SOLN: 2.0000 mg | INTRAVENOUS | Status: DC | PRN

## 2017-01-24 MED ORDER — DEXAMETHASONE SODIUM PHOSPHATE 10 MG/ML IJ SOLN
INTRAMUSCULAR | Status: AC
  Filled 2017-01-24: qty 1

## 2017-01-24 MED ORDER — FENTANYL CITRATE (PF) 100 MCG/2ML IJ SOLN
25.0000 ug | INTRAMUSCULAR | Status: DC | PRN
Start: 2017-01-24 — End: 2017-01-24
  Administered 2017-01-24 (×4): 25 ug via INTRAVENOUS

## 2017-01-24 MED ORDER — PROPOFOL 10 MG/ML IV BOLUS
INTRAVENOUS | Status: AC
  Filled 2017-01-24: qty 20

## 2017-01-24 MED ORDER — PROMETHAZINE HCL 25 MG/ML IJ SOLN: 6.2500 mg | INTRAMUSCULAR | Status: DC | PRN

## 2017-01-24 MED ORDER — ROCURONIUM BROMIDE 100 MG/10ML IV SOLN
INTRAVENOUS | Status: DC | PRN
  Administered 2017-01-24: 20 mg via INTRAVENOUS
  Administered 2017-01-24 (×2): 10 mg via INTRAVENOUS
  Administered 2017-01-24: 40 mg via INTRAVENOUS
  Administered 2017-01-24: 10 mg via INTRAVENOUS

## 2017-01-24 MED ORDER — SODIUM CHLORIDE 0.9 % IR SOLN
Status: DC | PRN
  Administered 2017-01-24: 600 mL

## 2017-01-24 MED ORDER — OXYCODONE HCL 5 MG PO TABS: 5.0000 mg | ORAL_TABLET | Freq: Once | ORAL | Status: DC | PRN

## 2017-01-24 MED ORDER — PROPOFOL 10 MG/ML IV BOLUS
INTRAVENOUS | Status: DC | PRN
  Administered 2017-01-24: 150 mg via INTRAVENOUS

## 2017-01-24 MED ORDER — CHLORHEXIDINE GLUCONATE CLOTH 2 % EX PADS
6.0000 | MEDICATED_PAD | Freq: Once | CUTANEOUS | Status: AC
  Administered 2017-01-24: 6 via TOPICAL

## 2017-01-24 MED ORDER — LIDOCAINE HCL (CARDIAC) 20 MG/ML IV SOLN
INTRAVENOUS | Status: DC | PRN
  Administered 2017-01-24: 4 mg via INTRAVENOUS
  Administered 2017-01-24: 40 mg via INTRAVENOUS

## 2017-01-24 MED ORDER — KCL IN DEXTROSE-NACL 20-5-0.45 MEQ/L-%-% IV SOLN
INTRAVENOUS | Status: DC
  Administered 2017-01-24: 100 mL/h via INTRAVENOUS
  Administered 2017-01-25: 06:00:00 via INTRAVENOUS
  Filled 2017-01-24 (×5): qty 1000

## 2017-01-24 MED ORDER — MIDAZOLAM HCL 2 MG/2ML IJ SOLN
INTRAMUSCULAR | Status: AC
  Filled 2017-01-24: qty 2

## 2017-01-24 MED ORDER — FENTANYL CITRATE (PF) 100 MCG/2ML IJ SOLN
INTRAMUSCULAR | Status: AC
  Filled 2017-01-24: qty 2

## 2017-01-24 MED ORDER — ROCURONIUM BROMIDE 50 MG/5ML IV SOLN
INTRAVENOUS | Status: AC
  Filled 2017-01-24: qty 1

## 2017-01-24 SURGICAL SUPPLY — 44 items
BLADE SURG SZ11 CARB STEEL (BLADE) ×3 IMPLANT
BULB RESERV EVAC DRAIN JP 100C (MISCELLANEOUS) ×3 IMPLANT
CANISTER SUCT 3000ML PPV (MISCELLANEOUS) ×3 IMPLANT
CHLORAPREP W/TINT 26ML (MISCELLANEOUS) ×3 IMPLANT
CUTTER FLEX LINEAR 45M (STAPLE) IMPLANT
DECANTER SPIKE VIAL GLASS SM (MISCELLANEOUS) ×3 IMPLANT
DERMABOND ADVANCED (GAUZE/BANDAGES/DRESSINGS) ×2
DERMABOND ADVANCED .7 DNX12 (GAUZE/BANDAGES/DRESSINGS) ×1 IMPLANT
DRAIN CHANNEL JP 19F (MISCELLANEOUS) ×3 IMPLANT
ELECT REM PT RETURN 9FT ADLT (ELECTROSURGICAL) ×3
ELECTRODE REM PT RTRN 9FT ADLT (ELECTROSURGICAL) ×1 IMPLANT
GLOVE BIO SURGEON STRL SZ7 (GLOVE) ×9 IMPLANT
GLOVE BIOGEL PI IND STRL 7.5 (GLOVE) ×3 IMPLANT
GLOVE BIOGEL PI INDICATOR 7.5 (GLOVE) ×6
GOWN STRL REUS W/ TWL LRG LVL4 (GOWN DISPOSABLE) IMPLANT
GOWN STRL REUS W/ TWL XL LVL3 (GOWN DISPOSABLE) ×3 IMPLANT
GOWN STRL REUS W/TWL LRG LVL4 (GOWN DISPOSABLE)
GOWN STRL REUS W/TWL XL LVL3 (GOWN DISPOSABLE) ×6
GRASPER SUT TROCAR 14GX15 (MISCELLANEOUS) ×6 IMPLANT
IRRIGATION STRYKERFLOW (MISCELLANEOUS) ×1 IMPLANT
IRRIGATOR STRYKERFLOW (MISCELLANEOUS) ×3
KIT RM TURNOVER STRD PROC AR (KITS) ×3 IMPLANT
NEEDLE HYPO 22GX1.5 SAFETY (NEEDLE) ×3 IMPLANT
NEEDLE INSUFFLATION 14GA 120MM (NEEDLE) ×3 IMPLANT
NS IRRIG 1000ML POUR BTL (IV SOLUTION) IMPLANT
PACK LAP CHOLECYSTECTOMY (MISCELLANEOUS) ×3 IMPLANT
POUCH SPECIMEN RETRIEVAL 10MM (ENDOMECHANICALS) ×3 IMPLANT
RELOAD 45 VASCULAR/THIN (ENDOMECHANICALS) IMPLANT
RELOAD STAPLE TA45 3.5 REG BLU (ENDOMECHANICALS) IMPLANT
SHEARS HARMONIC ACE PLUS 36CM (ENDOMECHANICALS) IMPLANT
SLEEVE ENDOPATH XCEL 5M (ENDOMECHANICALS) ×3 IMPLANT
SOL .9 NS 3000ML IRR  AL (IV SOLUTION) ×2
SOL .9 NS 3000ML IRR UROMATIC (IV SOLUTION) ×1 IMPLANT
SPONGE EXCIL AMD DRAIN 4X4 6P (MISCELLANEOUS) ×3 IMPLANT
SUT ETHILON 3-0 FS-10 30 BLK (SUTURE) ×3
SUT MNCRL AB 4-0 PS2 18 (SUTURE) ×3 IMPLANT
SUT VICRYL 0 UR6 27IN ABS (SUTURE) ×3 IMPLANT
SUT VICRYL AB 3-0 FS1 BRD 27IN (SUTURE) ×6 IMPLANT
SUTURE EHLN 3-0 FS-10 30 BLK (SUTURE) ×1 IMPLANT
TRAY FOLEY W/METER SILVER 16FR (SET/KITS/TRAYS/PACK) ×3 IMPLANT
TROCAR XCEL 12X100 BLDLESS (ENDOMECHANICALS) ×3 IMPLANT
TROCAR XCEL NON-BLD 11X100MML (ENDOMECHANICALS) ×3 IMPLANT
TROCAR XCEL NON-BLD 5MMX100MML (ENDOMECHANICALS) ×3 IMPLANT
TUBING INSUFFLATION (TUBING) ×3 IMPLANT

## 2017-01-24 NOTE — Anesthesia Procedure Notes (Signed)
Procedure Name: Intubation Date/Time: 01/24/2017 11:48 AM Performed by: Charna BusmanIAMOND, Joyleen Haselton Pre-anesthesia Checklist: Patient identified, Emergency Drugs available, Suction available, Patient being monitored and Timeout performed Patient Re-evaluated:Patient Re-evaluated prior to induction Oxygen Delivery Method: Circle system utilized Preoxygenation: Pre-oxygenation with 100% oxygen Induction Type: IV induction and Combination inhalational/ intravenous induction Ventilation: Mask ventilation without difficulty Laryngoscope Size: Miller and 3 Grade View: Grade II Tube type: Oral Tube size: 7.5 mm Number of attempts: 1 Airway Equipment and Method: Stylet Placement Confirmation: ETT inserted through vocal cords under direct vision,  positive ETCO2,  CO2 detector and breath sounds checked- equal and bilateral Secured at: 22 cm Tube secured with: Tape Dental Injury: Teeth and Oropharynx as per pre-operative assessment

## 2017-01-24 NOTE — Anesthesia Preprocedure Evaluation (Signed)
Anesthesia Evaluation  Patient identified by MRN, date of birth, ID band Patient awake    Reviewed: Allergy & Precautions, H&P , NPO status , Patient's Chart, lab work & pertinent test results  History of Anesthesia Complications (+) PONV and history of anesthetic complications  Airway Mallampati: III  TM Distance: <3 FB Neck ROM: full    Dental  (+) Poor Dentition, Chipped, Missing   Pulmonary neg pulmonary ROS, neg shortness of breath,           Cardiovascular Exercise Tolerance: Good (-) angina(-) Past MI and (-) DOE negative cardio ROS       Neuro/Psych  Headaches, negative psych ROS   GI/Hepatic negative GI ROS, Neg liver ROS, neg GERD  ,  Endo/Other  negative endocrine ROS  Renal/GU      Musculoskeletal   Abdominal   Peds  Hematology negative hematology ROS (+)   Anesthesia Other Findings Past Medical History: No date: Headache No date: Pre-diabetes No date: Rectal pain, chronic No date: Shoulder pain  Past Surgical History: 01/18/2017: LAPAROSCOPIC APPENDECTOMY; N/A     Comment:  Procedure: APPENDECTOMY LAPAROSCOPIC;  Surgeon: Ancil Linseyavis,               Jason Evan, MD;  Location: [facility] ORS;  Service: General;                Laterality: N/A; No date: SHOULDER SURGERY  BMI    Body Mass Index:  31.80 kg/m      Reproductive/Obstetrics negative OB ROS                             Anesthesia Physical Anesthesia Plan  ASA: III  Anesthesia Plan: General ETT   Post-op Pain Management:    Induction: Intravenous  PONV Risk Score and Plan: 3 and Ondansetron, Dexamethasone, Midazolam and Treatment may vary due to age or medical condition  Airway Management Planned: Oral ETT  Additional Equipment:   Intra-op Plan:   Post-operative Plan: Extubation in OR  Informed Consent: I have reviewed the patients History and Physical, chart, labs and discussed the procedure including the  risks, benefits and alternatives for the proposed anesthesia with the patient or authorized representative who has indicated his/her understanding and acceptance.   Dental Advisory Given  Plan Discussed with: Anesthesiologist, CRNA and Surgeon  Anesthesia Plan Comments: (Patient consented for risks of anesthesia including but not limited to:  - adverse reactions to medications - damage to teeth, lips or other oral mucosa - sore throat or hoarseness - Damage to heart, brain, lungs or loss of life  Patient voiced understanding.)        Anesthesia Quick Evaluation

## 2017-01-24 NOTE — Transfer of Care (Signed)
Immediate Anesthesia Transfer of Care Note  Patient: Glenn Reed  Procedure(s) Performed: DIAGNOSTIC LAPAROSCOPY, EVACUATION OF INTRA  ABDOMINAL HEMATOMA (N/A )  Patient Location: PACU  Anesthesia Type:General  Level of Consciousness: drowsy and patient cooperative  Airway & Oxygen Therapy: Patient Spontanous Breathing and Patient connected to face mask oxygen  Post-op Assessment: Report given to RN and Post -op Vital signs reviewed and stable  Post vital signs: Reviewed and stable  Last Vitals:  Vitals:   01/24/17 0512 01/24/17 1116  BP:  127/75  Pulse:  96  Resp: 20 18  Temp:  37.1 C  SpO2:  96%    Last Pain:  Vitals:   01/24/17 1116  TempSrc: Tympanic  PainSc: 0-No pain         Complications: No apparent anesthesia complications

## 2017-01-24 NOTE — Op Note (Addendum)
SURGICAL OPERATIVE REPORT  DATE OF PROCEDURE: 01/24/2017  ATTENDING Surgeon(s): Ancil Linsey, MD  ANESTHESIA: GETA (General)  PRE-OPERATIVE DIAGNOSIS: Diarrhea and cramping abdominal pain s/p laparoscopic appendectomy, intraperitoneal fluid (R19.7)  POST-OPERATIVE DIAGNOSIS: Post-surgical hemoperitoneum/subacute thrombus (K66.1)  PROCEDURE(S):  1.) Diagnostic laparoscopic with evacuation of hematoma and placement of RLQ - pelvic drain (cpt: 16109)  INTRAOPERATIVE FINDINGS: 300 - 400 mL of primarily thrombus in the peritoneal cavity, worst where cecum was mobilized from peritoneum along the Right lateral abdominal wall, along with some dark old-appearing blood in patient's pelvis without any bright red blood or active bleeding  INTRAVENOUS FLUIDS: 1100 mL crystalloid   ESTIMATED BLOOD LOSS: Minimal (<20 mL)  URINE OUTPUT: 200 mL   SPECIMENS: None  IMPLANTS: None  DRAINS: 19 F suprapubic drain to pelvis and RLQ  COMPLICATIONS: None apparent  CONDITION AT END OF PROCEDURE: Hemodynamically stable and extubated  DISPOSITION OF PATIENT: PACU  INDICATIONS FOR PROCEDURE:  Patient is a 51 y.o. otherwise healthy male who recently underwent uneventful laparoscopic appendectomy for retrocecal acute appendicitis and initially felt well until he presented to outpatient surgical office for diarrhea and cramping abdominal discomfort that he reports began 3-4 days after his recent surgery. Outpatient CT was performed and demonstrated intraperitoneal fluid and slightly longer than usual residual appendix, after which patient was referred to [facility] for direct admission, antibiotics, and further management. All risks, benefits, and alternatives to appendectomy were discussed with the patient, all of patient's questions were answered to his expressed satisfaction, and informed consent was accordingly obtained and documented.  DETAILS OF PROCEDURE: Patient was brought to the operating suite  and appropriately identified. General anesthesia was administered along with confirmation of appropriate pre-operative antibiotics, and endotracheal intubation was performed by anesthetist, along with NG/OG tube for gastric decompression. In supine position, patient's Dermabond skin glue from his prior surgery was removed and operative site was prepped and draped in usual sterile fashion. Following a brief time out, patient's recent 5 mm infraumbilical port site incision was reopened using a #15 blade scalpel. Fascia was then elevated, and a Verress needle was inserted and its proper position confirmed using saline meniscus test prior to abdominal insufflation.  Upon insufflation of the abdominal cavity with carbon dioxide to a well-tolerated pressure of 12-15 mmHg, a 5 mm peri-umbilical port followed by laparoscope were inserted and used to inspect the abdominal cavity and its contents with no injuries from insertion of the first trochar noted. Noted was a significant volume of intraperitoneal thrombus and dark blood without any bright red blood or bleeding appreciated. Two additional trocars were inserted, a 12 mm port at the patient's previous Left lower quadrant incision and another 5 mm port between patient's previous suprapubic incision and patient's LLQ due to adhesions to patient's lower anterior abdominal wall. Thrombus was evacuated with a combination of suction and irrigation/suction, after which pelvis and lateral peritoneal reflection were re-evaluated with no additional findings. Considering these findings and the relative inaccessibility of the appendiceal stump not appearing to be contributing to patient's current symptoms, decision was made not to remobilize the cecum to evaluate or further resect the residual appendiceal stump. A 19 F Blake drain was advanced through the patient's 12 mm LLQ port and externalized through the patient's lower LLQ/suprapubic 5 mm port site. The internal component was  directed to lay from the pelvis along the Right pericolic gutter/cecum, and the drain was secured to patient's skin using 3-0 nylon suture.  The intraperitoneal cavity was then inspected once  more with no additional findings. PMI laparoscopic fascial closure device was then used to re-approximate fascia at the 12 mm Left lower quadrant port site. All ports were then removed under direct visualization, and the abdominal cavity was desuflated. All port sites were irrigated/cleaned, additional local anesthetic was injected at each incision, 3-0 Vicryl was used to re-approximate dermis at 10/12 mm port site(s), and subcuticular 4-0 Monocryl suture was used to re-approximate skin. Skin was then cleaned, dried, and sterile skin glue was applied. Patient was then safely able to be awakened, extubated, and transferred to PACU for post-operative monitoring and care.   I was present for all aspects of procedure, and there were no intra-operative complications apparent.

## 2017-01-24 NOTE — Anesthesia Post-op Follow-up Note (Signed)
Anesthesia QCDR form completed.        

## 2017-01-24 NOTE — Progress Notes (Signed)
Spoke to Dr Earlene Plateravis on the phone.  Informed of almost 250cc of bright red blood from JP drain in about 1.5 hours.  BP and HR stable. He stated this is to be expected. I told him I would moniter pt closely and if JP volume increased. There were VS changes or pt became symptomatic, I would call back.

## 2017-01-24 NOTE — Progress Notes (Signed)
SURGICAL PROGRESS NOTE  Hospital Day(s): 1.   Post op day(s):  Marland Kitchen.   Interval History: Patient seen and examined, no acute events or new complaints since admission overnight. Patient reports ongoing diarrhea and mild intermittent crampy central and RLQ abdominal pain, slept okay overnight, denies N/V, fever/chills (though T 100.7 documented by RN ~midnight), CP, or SOB.  Review of Systems:  Constitutional: denies fever, chills  HEENT: denies cough or congestion  Respiratory: denies any shortness of breath  Cardiovascular: denies chest pain or palpitations  Gastrointestinal: abdominal pain, N/V, and bowel function as per interval history Genitourinary: denies burning with urination or urinary frequency Musculoskeletal: denies pain, decreased motor or sensation Integumentary: denies any other rashes or skin discolorations Neurological: denies HA or vision/hearing changes   Vital signs in last 24 hours: [min-max] current  Temp:  [98.1 F (36.7 C)-98.6 F (37 C)] 98.2 F (36.8 C) (10/26 0512) Pulse Rate:  [80-106] 80 (10/26 0512) Resp:  [18-20] 20 (10/26 0512) BP: (112-144)/(62-83) 127/63 (10/26 0512) SpO2:  [97 %-99 %] 99 % (10/26 0512) Weight:  [197 lb (89.4 kg)-199 lb (90.3 kg)] 197 lb (89.4 kg) (10/25 1707)     Height: 5\' 6"  (167.6 cm) Weight: 197 lb (89.4 kg) BMI (Calculated): 31.81   Intake/Output this shift:  No intake/output data recorded.   Intake/Output last 2 shifts:  @IOLAST2SHIFTS @   Physical Exam:  Constitutional: alert, cooperative and no distress  HENT: normocephalic without obvious abnormality  Eyes: PERRL, EOM's grossly intact and symmetric  Neuro: CN II - XII grossly intact and symmetric without deficit  Respiratory: breathing non-labored at rest  Cardiovascular: regular rate and sinus rhythm  Gastrointestinal: soft and non-distended with mild-/moderate- RLQ abdominal tenderness to palpation, incisions well-approximated without erythema or  drainage Musculoskeletal: UE and LE FROM, no edema or wounds, motor and sensation grossly intact, NT   Labs:  CBC Latest Ref Rng & Units 01/24/2017 01/23/2017 01/18/2017  WBC 3.8 - 10.6 K/uL 8.8 7.8 8.7  Hemoglobin 13.0 - 18.0 g/dL 10.6(L) 11.4(L) 14.8  Hematocrit 40.0 - 52.0 % 30.2(L) 32.9(L) 42.8  Platelets 150 - 440 K/uL 247 255 159   CMP Latest Ref Rng & Units 01/24/2017 01/23/2017 01/18/2017  Glucose 65 - 99 mg/dL 098(J133(H) 191(Y178(H) 782(N143(H)  BUN 6 - 20 mg/dL 14 14 56(O23(H)  Creatinine 0.61 - 1.24 mg/dL 1.300.94 8.650.81 7.841.11  Sodium 135 - 145 mmol/L 137 135 138  Potassium 3.5 - 5.1 mmol/L 3.7 3.6 4.2  Chloride 101 - 111 mmol/L 104 98(L) 103  CO2 22 - 32 mmol/L 29 28 28   Calcium 8.9 - 10.3 mg/dL 6.9(G8.5(L) 2.9(B8.8(L) 9.0  Total Protein 6.5 - 8.1 g/dL - 7.2 7.4  Total Bilirubin 0.3 - 1.2 mg/dL - 1.3(H) 0.9  Alkaline Phos 38 - 126 U/L - 48 51  AST 15 - 41 U/L - 22 38  ALT 17 - 63 U/L - 35 62   Imaging studies: No new pertinent imaging studies   Assessment/Plan: (ICD-10's: R19.7, R10.83) 51 y.o. male with diarrhea and crampy non-specific abdominal pain with mild RLQ tenderness, residual appendix and intra-abdominal fluid of uncertain significance/etiology on CT 5 days s/p laparoscopic appendectomy with peritoneal washout for acute non-perforated appendicitis, complicated by pertinent comorbidities including GERD with gastritis relieved with Prilosec and pre-diabetes.   - NPO, IVF  - IV antibiotics  - all risks, benefits, and alternatives to diagnostic laparoscopy with washout, possible drainage of intra-abdominal fluid, and possible completion appendectomy were discussed with the patient and his family, all  of their questions were answered to their expressed satisfaction, patient expresses he wishes to proceed, and informed consent was accordingly obtained.   - will plan for diagnostic laparoscopy, possible drainage of intra-abdominal fluid and possible completion appendectomy today pending OR  availability  All of the above findings and recommendations were discussed with the patient, patient's family, and patient's RN, and all of patient's and family's questions were answered to their expressed satisfaction.  -- Scherrie Gerlach Earlene Plater, MD, RPVI [place]: [facility] General Surgery - Partnering for exceptional care. Office: [phone]

## 2017-01-25 DIAGNOSIS — K661 Hemoperitoneum: Secondary | ICD-10-CM

## 2017-01-25 LAB — CBC
HCT: 29.8 % — ABNORMAL LOW (ref 40.0–52.0)
Hemoglobin: 10.5 g/dL — ABNORMAL LOW (ref 13.0–18.0)
MCH: 30.3 pg (ref 26.0–34.0)
MCHC: 35.2 g/dL (ref 32.0–36.0)
MCV: 85.9 fL (ref 80.0–100.0)
PLATELETS: 270 10*3/uL (ref 150–440)
RBC: 3.47 MIL/uL — AB (ref 4.40–5.90)
RDW: 13.2 % (ref 11.5–14.5)
WBC: 9.3 10*3/uL (ref 3.8–10.6)

## 2017-01-25 MED ORDER — CIPROFLOXACIN HCL 500 MG PO TABS: 500.0000 mg | ORAL_TABLET | Freq: Two times a day (BID) | ORAL | 0 refills | Status: AC

## 2017-01-25 MED ORDER — METRONIDAZOLE 500 MG PO TABS: 500.0000 mg | ORAL_TABLET | Freq: Three times a day (TID) | ORAL | 0 refills | Status: AC

## 2017-01-25 NOTE — Anesthesia Postprocedure Evaluation (Signed)
Anesthesia Post Note  Patient: Scientist, [name]  Procedure(s) Performed: DIAGNOSTIC LAPAROSCOPY, EVACUATION OF INTRA  ABDOMINAL HEMATOMA (N/A )  Patient location during evaluation: PACU Anesthesia Type: General Level of consciousness: awake and alert Pain management: pain level controlled Vital Signs Assessment: post-procedure vital signs reviewed and stable Respiratory status: spontaneous breathing, nonlabored ventilation, respiratory function stable and patient connected to nasal cannula oxygen Cardiovascular status: blood pressure returned to baseline and stable Postop Assessment: no apparent nausea or vomiting Anesthetic complications: no     Last Vitals:  Vitals:   01/24/17 1709 01/25/17 0533  BP: 113/62 (!) 116/53  Pulse: 84 80  Resp:  18  Temp:  36.6 C  SpO2:  98%    Last Pain:  Vitals:   01/25/17 1114  TempSrc:   PainSc: 0-No pain                 Cleda MccreedyJoseph K Piscitello

## 2017-01-25 NOTE — Discharge Instructions (Signed)
In addition to included general post-operative instructions for Diagnostic Laparoscopy and Evacuation of Intra-Abdominal Hematoma,  Diet: Resume home heart healthy diet.   Activity: No heavy lifting >15 pounds (children, pets, laundry, garbage) or strenuous activity until follow-up, but light activity and walking are encouraged. Do not drive or drink alcohol if taking narcotic pain medications.  Wound care: Drain care, dressings, and record volumes emptied as per nursing instructions. 2 days after surgery (Sunday, 10/28), may shower/get incision wet with soapy water and pat dry (do not rub incisions), but no baths or submerging incision underwater until follow-up.   Medications: Resume all home medications. For mild to moderate pain: acetaminophen (Tylenol). Combining Tylenol with alcohol can substantially increase your risk of causing liver disease. Narcotic pain medications, if prescribed, can be used for severe pain, though may cause nausea, constipation, and drowsiness. Do not combine Tylenol and Percocet within a 6 hour period as Percocet contains Tylenol. If you do not need the narcotic pain medication, you do not need to fill the prescription.  Call office [phone]) at any time if any questions, worsening pain, fevers/chills, bleeding, drainage from incision site, or other concerns.

## 2017-01-25 NOTE — Discharge Summary (Signed)
Physician Discharge Summary  Patient ID: Glenn Reed MRN: [id number] DOB/AGE: 1965-09-05 51 y.o.  Admit date: 01/23/2017 Discharge date: 01/25/2017  Admission Diagnoses:  Discharge Diagnoses:  Active Problems:   Abdominal pain   Discharged Condition: good  Hospital Course: 51 y.o. male presented 5 days s/p laparoscopic appendectomy to outpatient surgery [facility] for diarrhea and intermittent crampy abdominal pain that began 3-4 days after his recent surgery. Workup was found to be significant for CT imaging that demonstrated intraperitoneal fluid "slightly more enhancing than simple fluid" and a short residual appendiceal stump. Informed consent was obtained and documented, and patient underwent diagnostic laparoscopy, during which intraperitoneal clot was found and evacuated Earlene Plater(Davis, 01/24/2017).  Post-operatively, patient's diarrhea and pain completely resolved with patient stating "When I woke up this morning, everything was better; even colors are brighter" and advancement of patient's diet and ambulation were well-tolerated. The remainder of patient's hospital course was unremarkable, and discharge planning was initiated accordingly with patient safely able to be discharged home with appropriate discharge instructions, antibiotics, pain control, and close outpatient surgical follow-up after all of his and his family's questions were answered to their expressed satisfaction.  Consults: None  Significant Diagnostic Studies: radiology: CT scan: intraperitoneal fluid and residual appendiceal stump  Treatments: IV hydration, antibiotics: Zosyn and surgery: diagnostic laparoscopy with evacuation of post-surgical intraperitoneal hematoma  Discharge Exam: Blood pressure (!) 116/53, pulse 80, temperature 97.8 F (36.6 C), temperature source Oral, resp. rate 18, height 5\' 6"  (1.676 m), weight 197 lb (89.4 kg), SpO2 98 %. General appearance: alert, cooperative and no distress GI:  abdomen soft and completely non-tender except when stripping the tubing of well-secured surgically placed peritoneal drain with moderate non-purulent serosanguinous fluid, incisions well-approximated without erythema or drainage  Disposition: 01-Home or Self Care   Allergies as of 01/25/2017      Reactions   Prednisone Other (See Comments)   Hyperglycemia and "didn't feel well" +/- psychosis      Medication List    TAKE these medications   acetaminophen 325 MG tablet Commonly known as:  TYLENOL Take 650 mg by mouth every 6 (six) hours as needed.   ciprofloxacin 500 MG tablet Commonly known as:  CIPRO Take 1 tablet (500 mg total) by mouth 2 (two) times daily.   metroNIDAZOLE 500 MG tablet Commonly known as:  FLAGYL Take 1 tablet (500 mg total) by mouth 3 (three) times daily.      Follow-up Information    Ancil Linseyavis, Jason Evan, MD. Schedule an appointment as soon as possible for a visit in 1 week(s).   Specialty:  General Surgery Contact information: [address] [place] [address] [phone]           Signed: Ancil LinseyJason Evan Davis 01/25/2017, 10:40 AM

## 2017-01-25 NOTE — Progress Notes (Signed)
Pt discharged per MD order. IV removed. Pt educated on how to drain JP and how to document output. Reviewed discharge instructions with pt. Prescriptions given to pt. Questions answered to pt satisfaction. Pt taken to car in wheelchair.

## 2017-01-26 ENCOUNTER — Encounter: Payer: Self-pay | Admitting: Surgery

## 2017-01-27 ENCOUNTER — Telehealth: Payer: Self-pay

## 2017-01-27 NOTE — Telephone Encounter (Signed)
Patient will be in on Wednesday to see Dr. Earlene Plateravis for Post Operation.

## 2017-01-27 NOTE — Telephone Encounter (Signed)
Patient is coming in on 01/31/2017 to see Dr. Earlene Plateravis.

## 2017-01-29 ENCOUNTER — Ambulatory Visit (INDEPENDENT_AMBULATORY_CARE_PROVIDER_SITE_OTHER): Payer: BLUE CROSS/BLUE SHIELD | Admitting: Surgery

## 2017-01-29 ENCOUNTER — Encounter: Payer: Self-pay | Admitting: Surgery

## 2017-01-29 VITALS — BP 138/83 | HR 87 | Temp 98.3°F | Ht 66.0 in | Wt 197.6 lb

## 2017-01-29 DIAGNOSIS — K661 Hemoperitoneum: Secondary | ICD-10-CM

## 2017-01-29 DIAGNOSIS — K353 Acute appendicitis with localized peritonitis, without perforation or gangrene: Secondary | ICD-10-CM

## 2017-01-29 NOTE — Progress Notes (Signed)
Surgical Clinic Progress/Follow-up Note   HPI:  51 y.o. Male presents to clinic for post-op follow-up evaluation s/p laparoscopic evacuation of hemoperitoneum s/p laparoscopic appendectomy. Patient reports he is feeling much better, denies abdominal pain, N/V, diarrhea, fever/chills, CP, or SOB.  Review of Systems:  Constitutional: denies any other weight loss, fever, chills, or sweats  Eyes: denies any other vision changes, history of eye injury  ENT: denies sore throat, hearing problems  Respiratory: denies shortness of breath, wheezing  Cardiovascular: denies chest pain, palpitations  Gastrointestinal: abdominal pain, N/V, and bowel function as per HPI Musculoskeletal: denies any other joint pains or cramps  Skin: Denies any other rashes or skin discolorations  Neurological: denies any other headache, dizziness, weakness  Psychiatric: denies any other depression, anxiety  All other review of systems: otherwise negative   Vital Signs:  BP 138/83   Pulse 87   Temp 98.3 F (36.8 C) (Oral)   Ht 5\' 6"  (1.676 m)   Wt 197 lb 9.6 oz (89.6 kg)   BMI 31.89 kg/m    Physical Exam:  Constitutional:  -- Normal body habitus  -- Awake, alert, and oriented x3  Eyes:  -- Pupils equally round and reactive to light  -- No scleral icterus  Ear, nose, throat:  -- No jugular venous distension  -- No nasal drainage, bleeding Pulmonary:  -- No crackles -- Equal breath sounds bilaterally -- Breathing non-labored at rest Cardiovascular:  -- S1, S2 present  -- No pericardial rubs  Gastrointestinal:  -- Soft and non-distended with mild suprapubic peri-drain tenderness to palpation, no guarding/rebound, drain well-secured with small volume serosanguinous fluid in the drain, no surrounding erythema or drainage  -- No abdominal masses appreciated, pulsatile or otherwise  Musculoskeletal / Integumentary:  -- Wounds or skin discoloration: None appreciated except post-surgical abdominal wounds  as above (GI) -- Extremities: B/L UE and LE FROM, hands and feet warm, no edema  Neurologic:  -- Motor function: intact and symmetric  -- Sensation: intact and symmetric   Assessment:  51 y.o. yo Male with a problem list including...  Patient Active Problem List   Diagnosis Date Noted  . Hemoperitoneum   . Abdominal pain 01/23/2017  . Acute appendicitis with localized peritonitis   . Anal fissure 11/02/2012    presents to clinic for post-op follow-up evaluation, doing well s/p laparoscopic evacuation of hemoperitoneum s/p laparoscopic appendectomy.  Plan:   - drain removed   - pain control prn (never took narcotics)  - okay to gradually resume normal activities, okay to shower and submerge incisions under water prn  - return to clinic as needed, instructed to call office if any questions or concerns  All of the above recommendations were discussed with the patient and patient's daughter, and all of patient's and family's questions were answered to their expressed satisfaction.  -- Scherrie GerlachJason E. Earlene Plateravis, MD, RPVI [place]: [facility] - Partnering for exceptional care. Office: [phone]

## 2017-01-29 NOTE — Patient Instructions (Signed)

## 2017-01-31 ENCOUNTER — Encounter: Payer: Self-pay | Admitting: Surgery

## 2018-04-11 IMAGING — CT CT ABD-PELV W/O CM
2 of 4 series · 15 of 46 positions shown, 17 images · non-contrast
Comparison: Prior ultrasound from 07/11/2016.

CLINICAL DATA: Initial evaluation for acute lower abdominal pain,
vomiting.

EXAM:
CT ABDOMEN AND PELVIS WITHOUT CONTRAST
TECHNIQUE: Multidetector CT imaging of the abdomen and pelvis was performed
following the standard protocol without IV contrast.

[Series 2: routine abd/pel wo · axial · 0.78mm/px · z∈[-751,-281]mm · 12 of 104 slices shown, 14 images]
[im 5/104  soft-tissue]
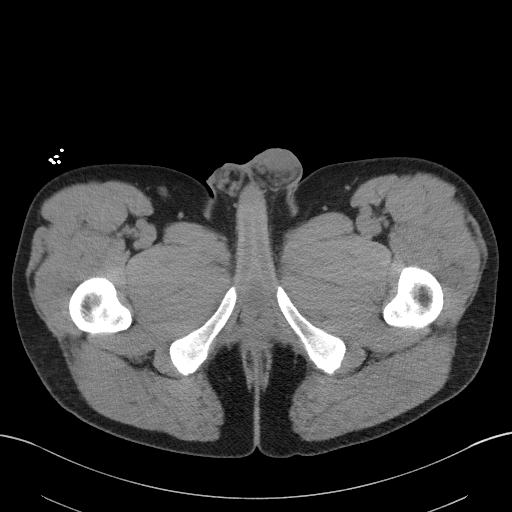
[im 5/104  bone]
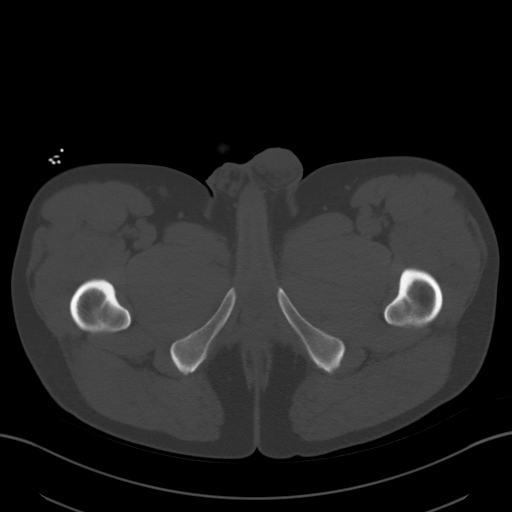
[im 13/104  soft-tissue]
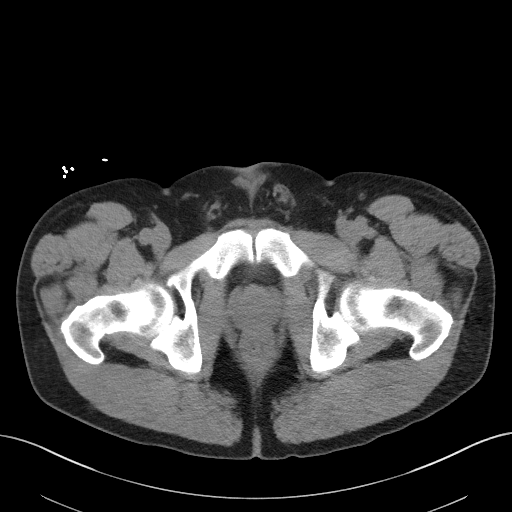
[im 22/104  soft-tissue]
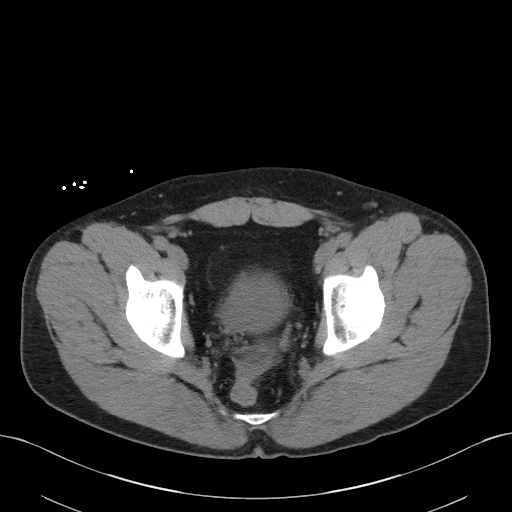
[im 31/104  soft-tissue]
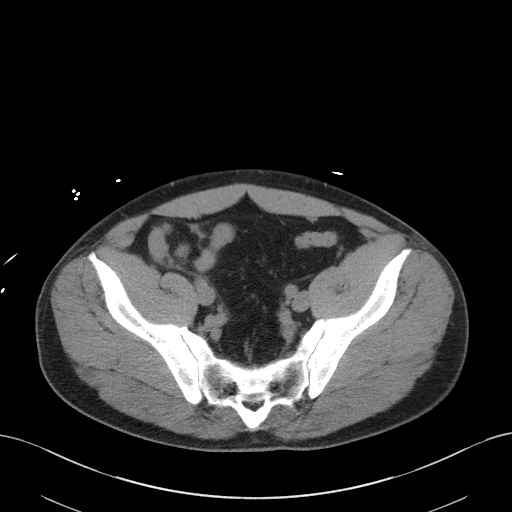
[im 39/104  soft-tissue]
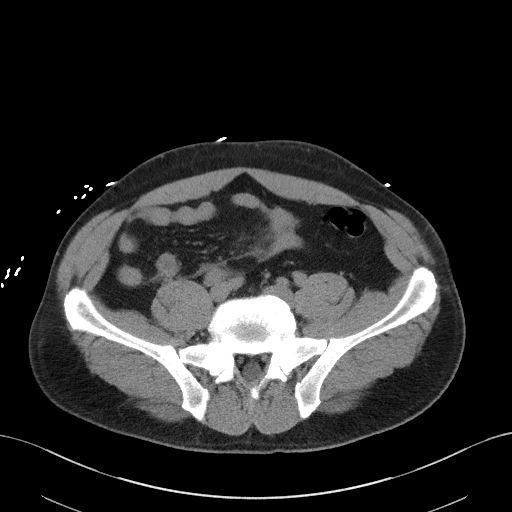
[im 48/104  soft-tissue]
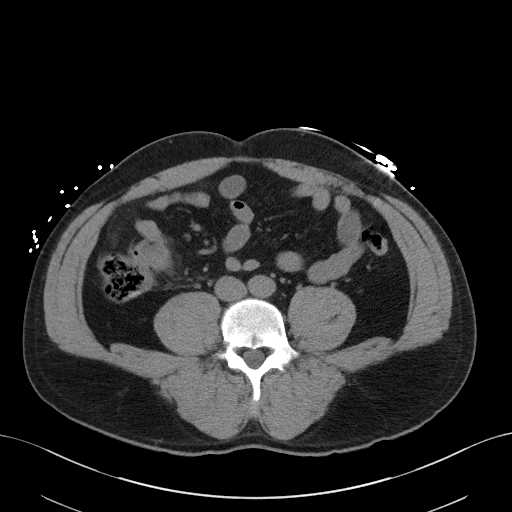
[im 56/104  soft-tissue]
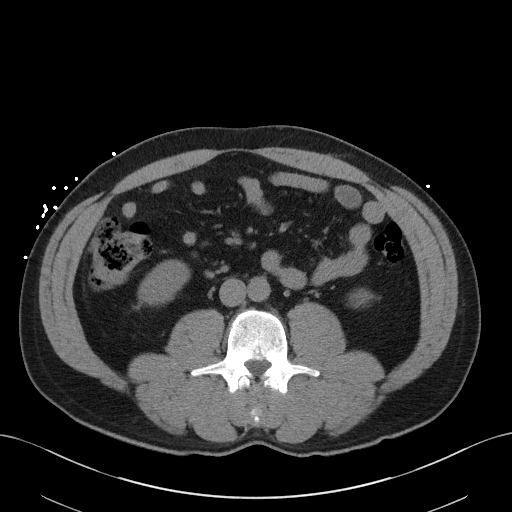
[im 65/104  soft-tissue]
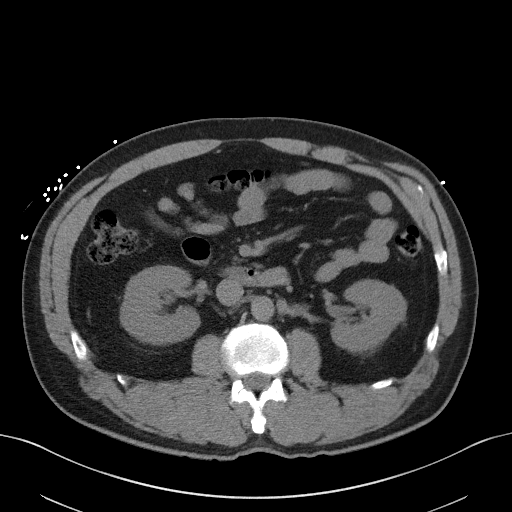
[im 73/104  soft-tissue]
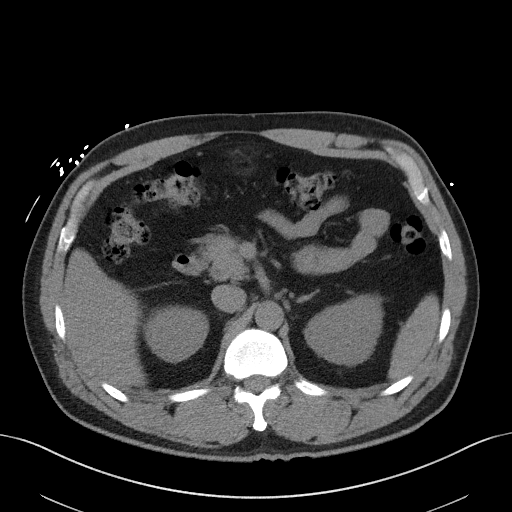
[im 73/104  bone]
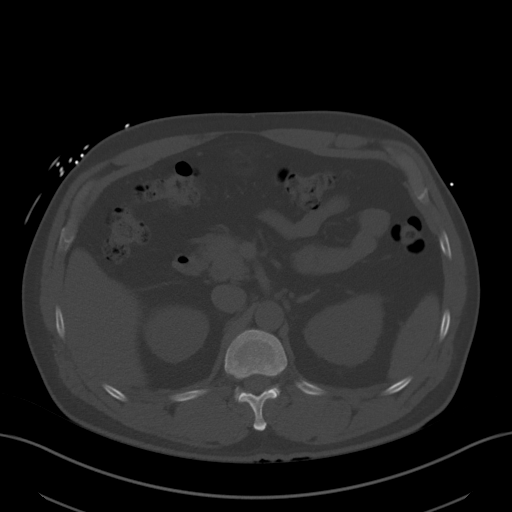
[im 82/104  soft-tissue]
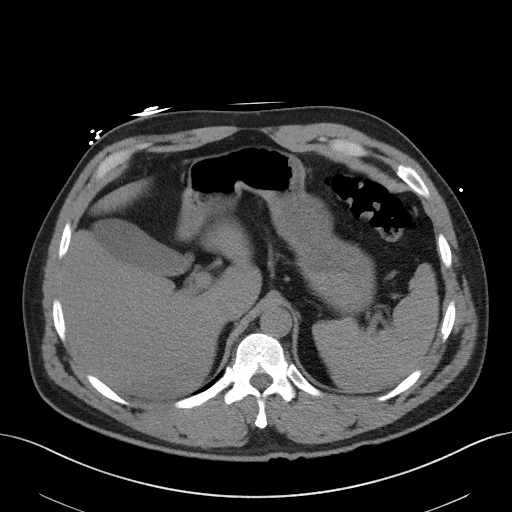
[im 91/104  soft-tissue]
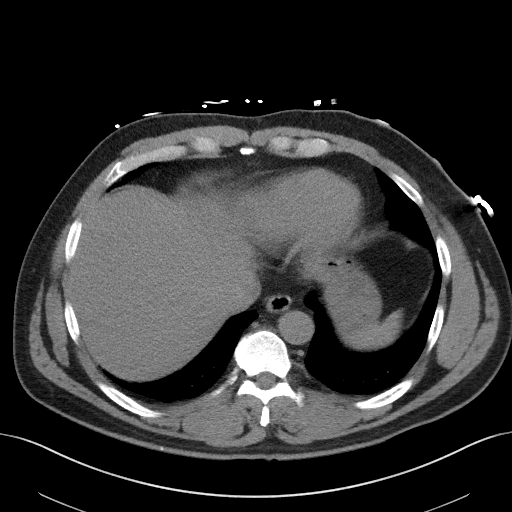
[im 99/104  soft-tissue]
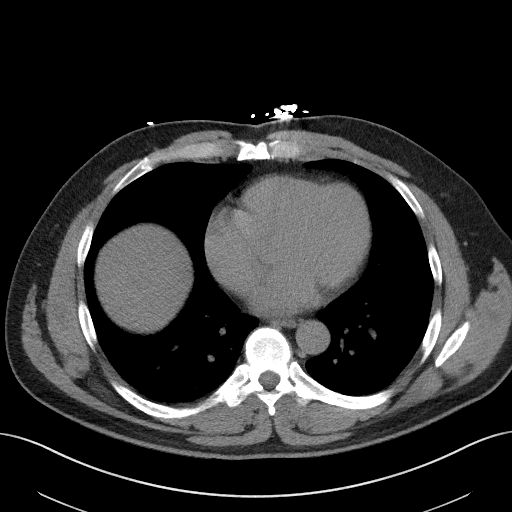

[Series 5: coronal st · coronal · 0.74mm/px · 3 of 91 slices shown]
[im 31/91  soft-tissue]
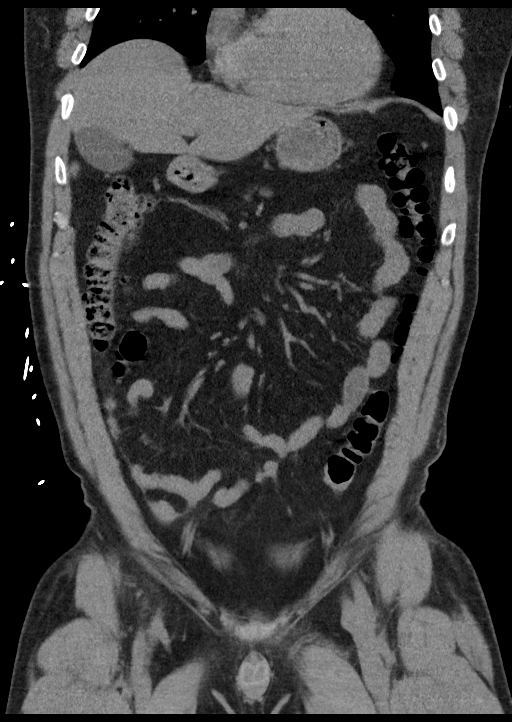
[im 41/91  soft-tissue]
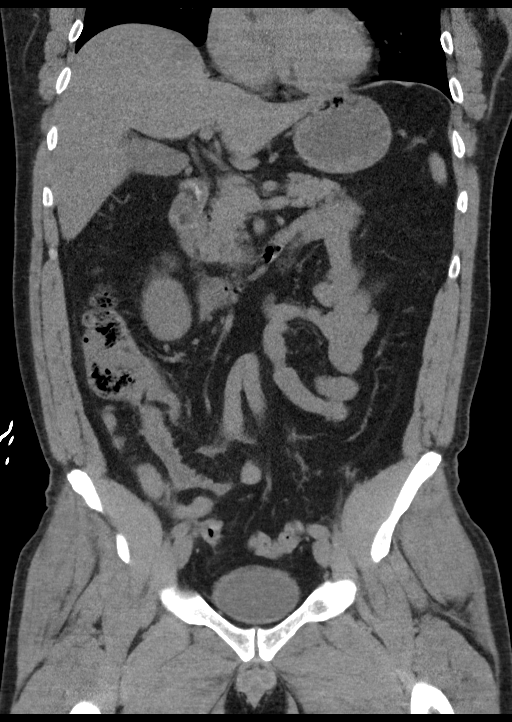
[im 51/91  soft-tissue]
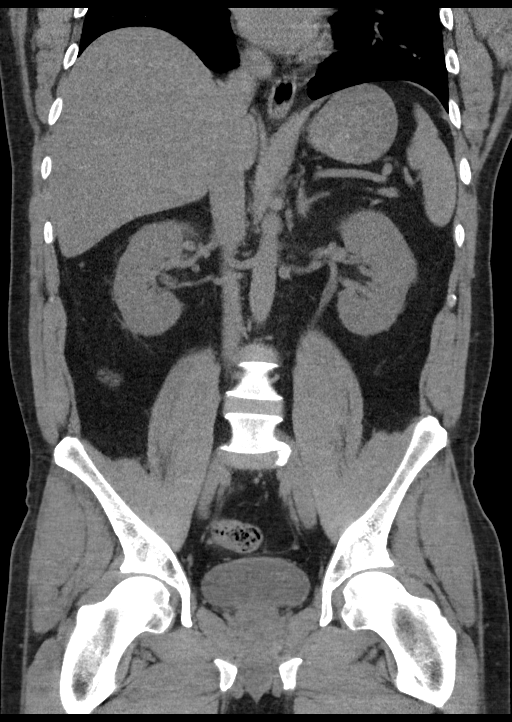

[15 of 46 positions shown; findings below may reference images not displayed]

FINDINGS: Lower chest: Mild subsegmental atelectasis present dependently
within the visualized lung bases. Visualized lungs are otherwise
clear.

Hepatobiliary: Limited noncontrast evaluation of the liver is
unremarkable. Gallbladder within normal limits. No biliary
dilatation.

Pancreas: Pancreas within normal limits.

Spleen: Spleen within normal limits.

Adrenals/Urinary Tract: Adrenal glands are within normal limits.
Kidneys equal in size without evidence for nephrolithiasis or
hydronephrosis. No hydroureter. Bladder within normal limits.

Stomach/Bowel: Stomach within normal limits. No evidence for bowel
obstruction. Appendix dilated up to 11 mm with associated
periappendiceal inflammatory fat stranding, suspicious for acute
appendicitis (series 2, image 61). No calcified appendicolith
identified. No free air to suggest perforation or other
complication. No other acute inflammatory changes seen about the
bowels.

Vascular/Lymphatic: Intra-abdominal aorta of normal caliber. No
adenopathy.

Reproductive: Prostate normal.

Other: Small volume free fluid within the pelvis, likely reactive.
No free intraperitoneal air.

Musculoskeletal: No acute osseous abnormality. No worrisome lytic or
blastic osseous lesions. Chronic bilateral pars defects at L5 with
trace spondylolisthesis.
IMPRESSION: 1. Findings consistent with acute appendicitis. No evidence for
perforation or other complication identified on this noncontrast
examination.
2. No other acute intra-abdominal or pelvic process identified.
3. Chronic bilateral pars defects at L5 with associated trace
spondylolisthesis.

## 2018-04-16 IMAGING — CT CT ABD-PELV W/ CM
1 of 3 series · 12 of 32 positions shown, 18 images · IV contrast (APPLIED)
Comparison: January 18, 2017

CLINICAL DATA: Patient had appendectomy on January 18, 2017. The
patient complains of epigastric pain, fever and chills since
surgery.

EXAM:
CT ABDOMEN AND PELVIS WITH CONTRAST
TECHNIQUE: Multidetector CT imaging of the abdomen and pelvis was performed
using the standard protocol following bolus administration of
intravenous contrast.
CONTRAST:  100mL [id number] IOPAMIDOL ([id number]) INJECTION 61%

[Series 2: axial st · axial · 0.84mm/px · z∈[-1012,-552]mm · 12 of 108 slices shown, 18 images]
[im 8/108  soft-tissue]
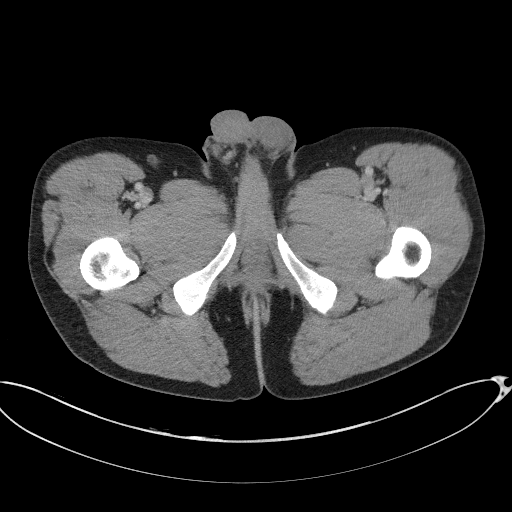
[im 8/108  bone]
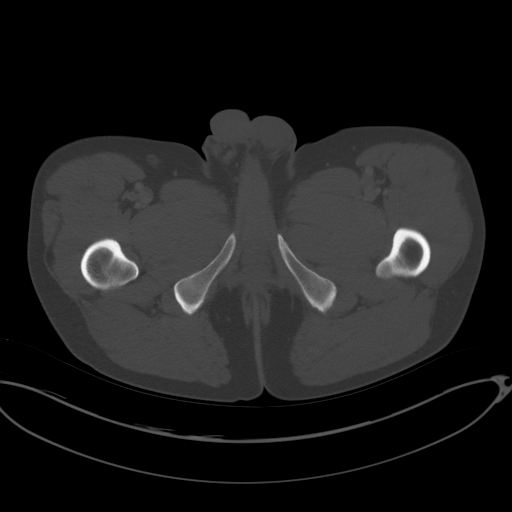
[im 16/108  soft-tissue]
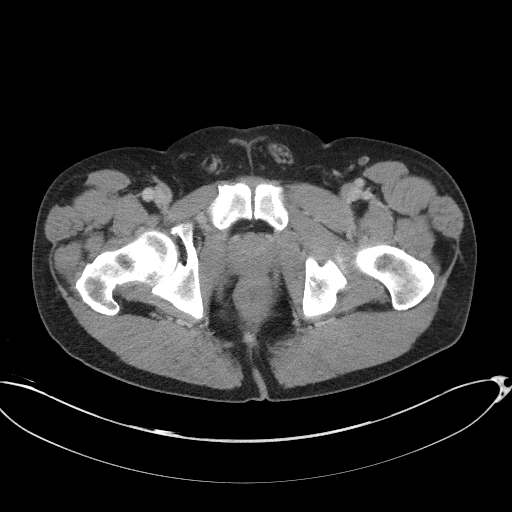
[im 23/108  soft-tissue]
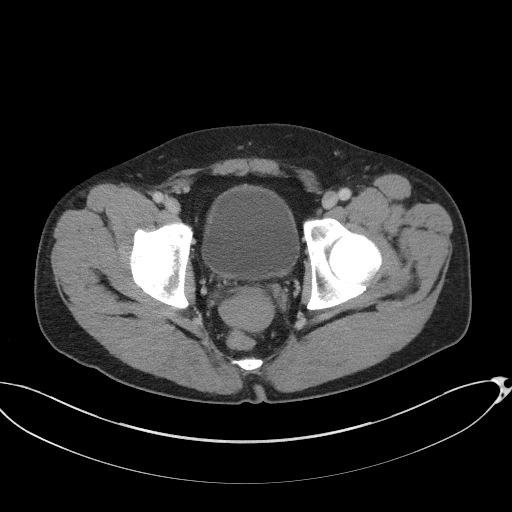
[im 31/108  soft-tissue]
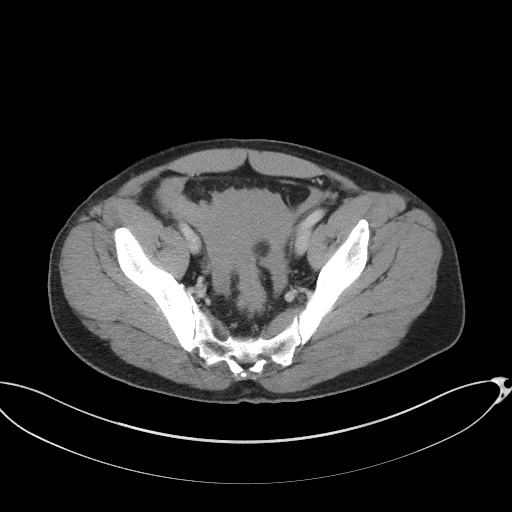
[im 39/108  soft-tissue]
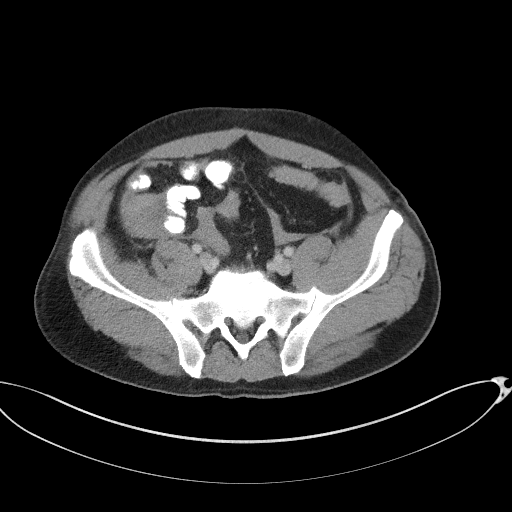
[im 46/108  soft-tissue]
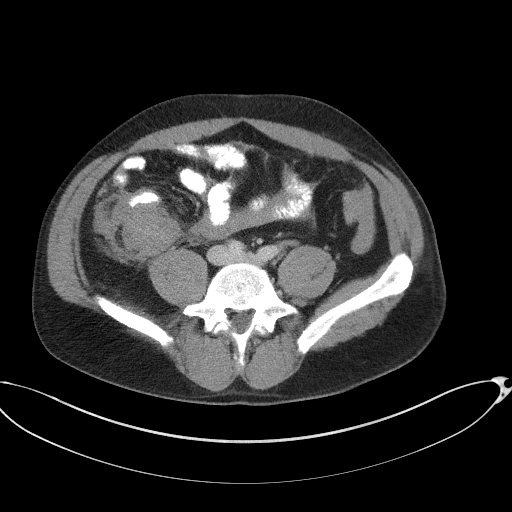
[im 62/108  soft-tissue]
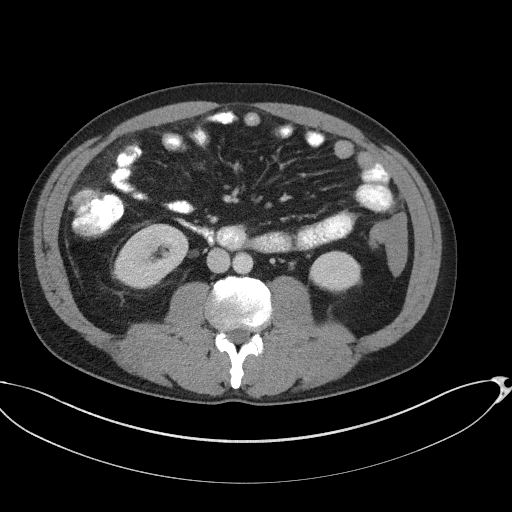
[im 69/108  soft-tissue]
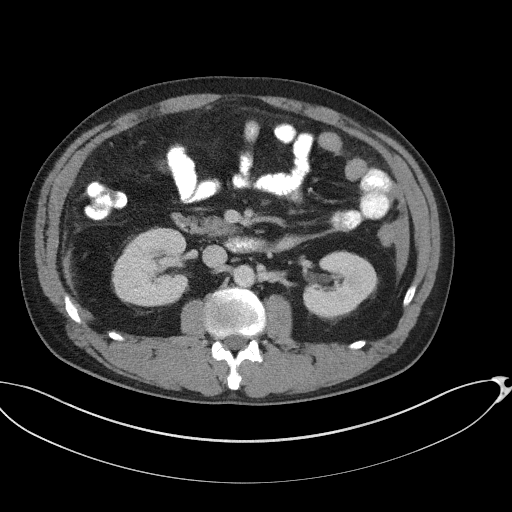
[im 77/108  soft-tissue]
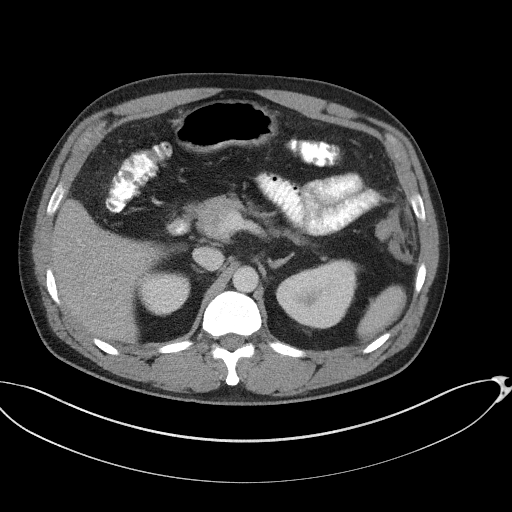
[im 77/108  lung]
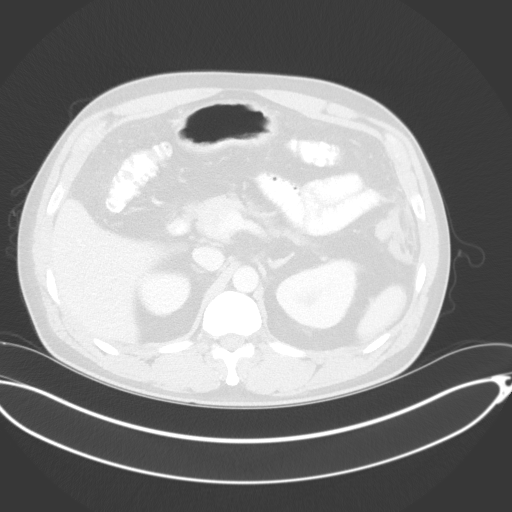
[im 77/108  bone]
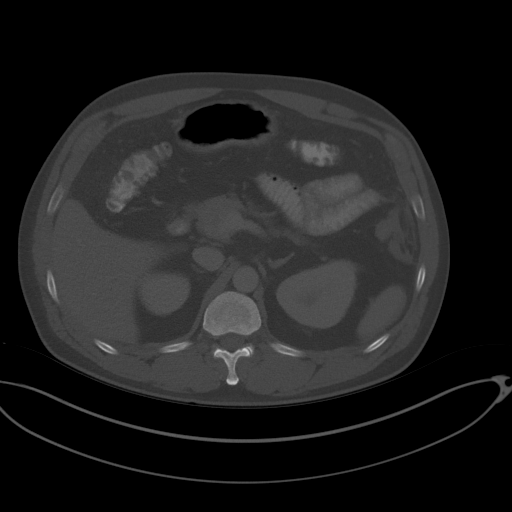
[im 85/108  soft-tissue]
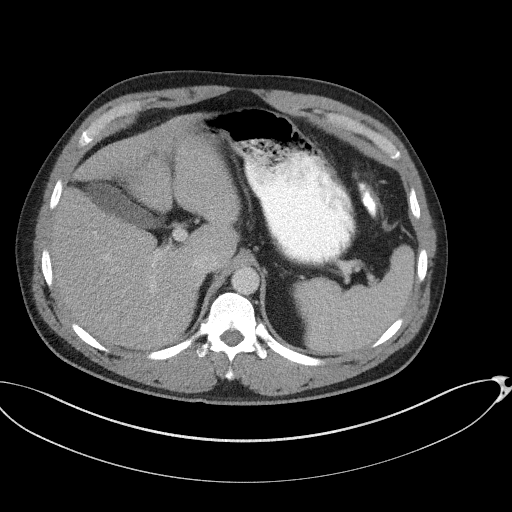
[im 85/108  lung]
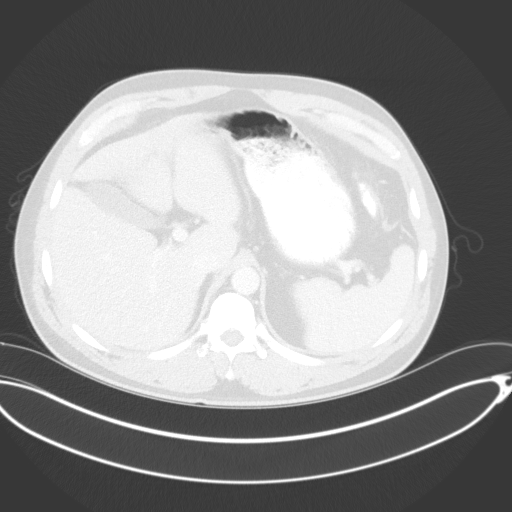
[im 92/108  soft-tissue]
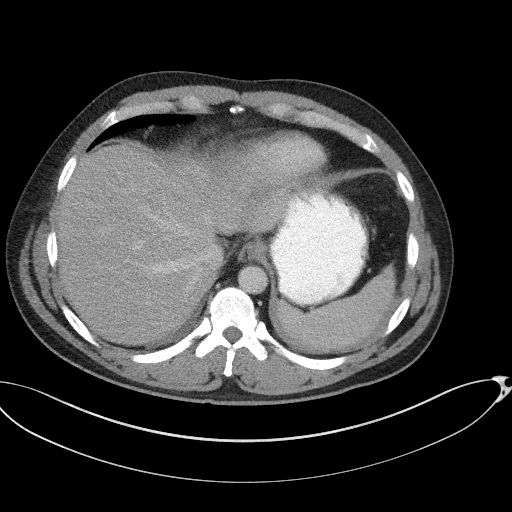
[im 92/108  lung]
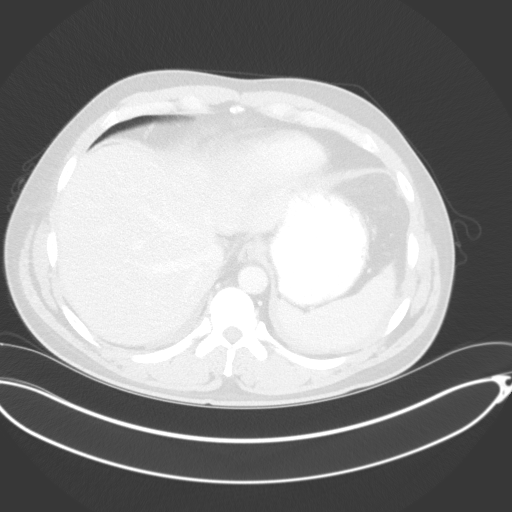
[im 100/108  soft-tissue]
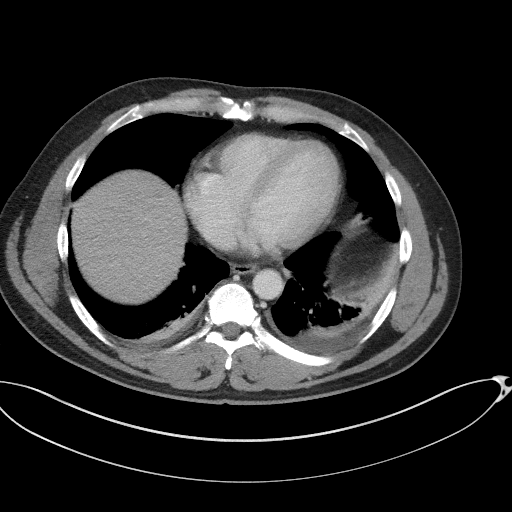
[im 100/108  lung]
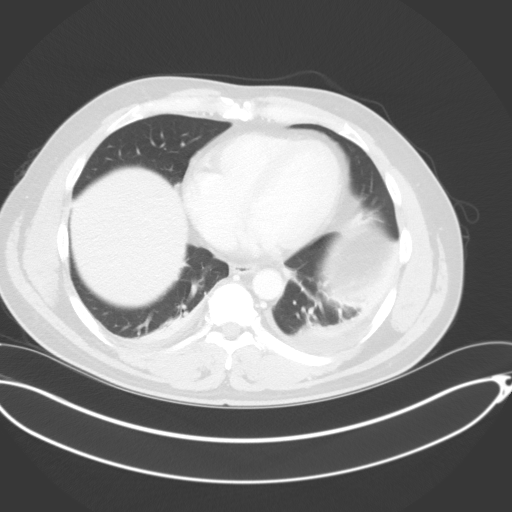

[12 of 32 positions shown; findings below may reference images not displayed]

FINDINGS: Lower chest: There are minimal bilateral pleural effusions with
atelectasis of bilateral posterior lung bases. The heart size is
normal.

Hepatobiliary: There is diffuse low density of the liver without
focal liver lesion or vessel displacement. The gallbladder and
biliary tree are normal.

Pancreas: Unremarkable. No pancreatic ductal dilatation or
surrounding inflammatory changes.

Spleen: Normal in size without focal abnormality.

Adrenals/Urinary Tract: Adrenal glands are unremarkable. Kidneys are
normal, without renal calculi, focal lesion, or hydronephrosis.
Bladder is unremarkable.

Stomach/Bowel: In the right lower quadrant in expected location of
the appendix, there is 3 cm length tubular shaped contrast extending
from the cecum. This is maybe due to residual appendix with contrast
in place but extravasation of contrast from the cecum is not
excluded. There is a 4.3 x 3.2 cm masslike lesion posterior to the
terminal ileum and anterior to the linear tubular shaped contrast
extending from the cecum. This masslike area head slightly higher
density dense simple fluid. This may represent a focal hematoma, but
abscess is not excluded. Moderate amount of high density fluid is
identified throughout the pelvis, question blood/hemorrhage.

There are mild dilated small bowel loops with mild thickened wall in
the abdomen probably due to postoperative small bowel ileus. The
stomach is normal.

Vascular/Lymphatic: No significant vascular findings are present. No
enlarged abdominal or pelvic lymph nodes.

Reproductive: Prostate is unremarkable.

Other: None.

Musculoskeletal: Bilateral pars defects of L5 are identified. No
acute abnormality noted.
IMPRESSION: In the right lower quadrant in expected location of the appendix,
there is 3 cm length tubular shaped contrast extending from the
cecum. This is maybe due to residual appendix with contrast in place
but extravasation of contrast from the cecum is not excluded. There
is a 4.3 x 3.2 cm masslike lesion posterior to the terminal ileum
and anterior to the linear tubular shaped contrast extending from
the cecum. This masslike area head slightly higher density dense
simple fluid. This may represent a focal hematoma, but abscess is
not excluded. Moderate amount of high density fluid is identified
throughout the pelvis, question blood/hemorrhage.

Postoperative small bowel ileus.

## 2019-11-22 ENCOUNTER — Other Ambulatory Visit: Payer: Self-pay | Admitting: Family Medicine

## 2019-11-22 DIAGNOSIS — R7989 Other specified abnormal findings of blood chemistry: Secondary | ICD-10-CM

## 2019-11-30 ENCOUNTER — Ambulatory Visit
Admission: RE | Admit: 2019-11-30 | Discharge: 2019-11-30 | Disposition: A | Discharge status: Home / Self Care | Payer: BC Managed Care – PPO | Source: Ambulatory Visit | Attending: Family Medicine | Admitting: Family Medicine

## 2019-11-30 ENCOUNTER — Other Ambulatory Visit: Payer: Self-pay

## 2019-11-30 DIAGNOSIS — R945 Abnormal results of liver function studies: Secondary | ICD-10-CM | POA: Insufficient documentation

## 2019-11-30 DIAGNOSIS — R7989 Other specified abnormal findings of blood chemistry: Secondary | ICD-10-CM

## 2021-02-20 IMAGING — US US ABDOMEN COMPLETE
1 series · 14 of 25 positions shown · non-contrast
Comparison: CT abdomen pelvis dated 01/23/2017.

CLINICAL DATA: Abnormal liver function tests.

EXAM:
ABDOMEN ULTRASOUND COMPLETE

[Series 1: us abdomen complete · 0.23mm/px · 14 of 76 slices shown]
[im 1/76]
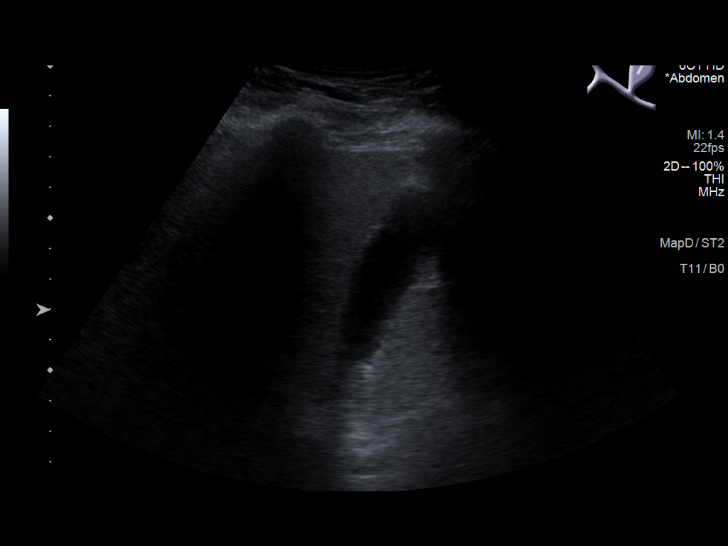
[im 7/76]
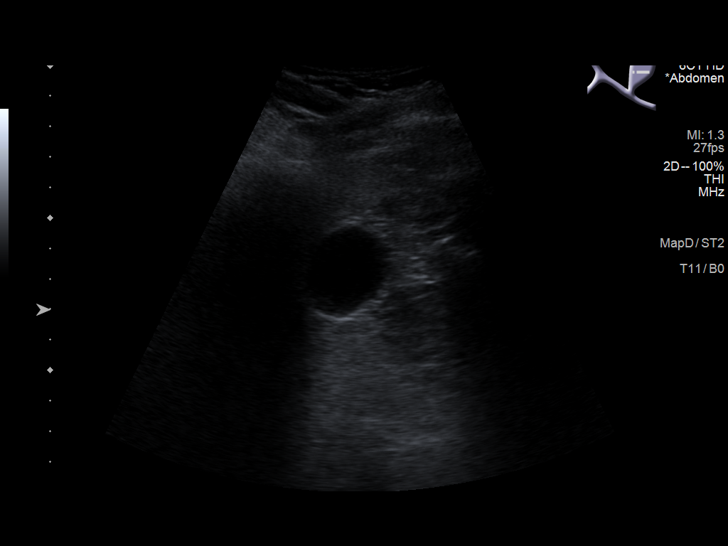
[im 13/76]
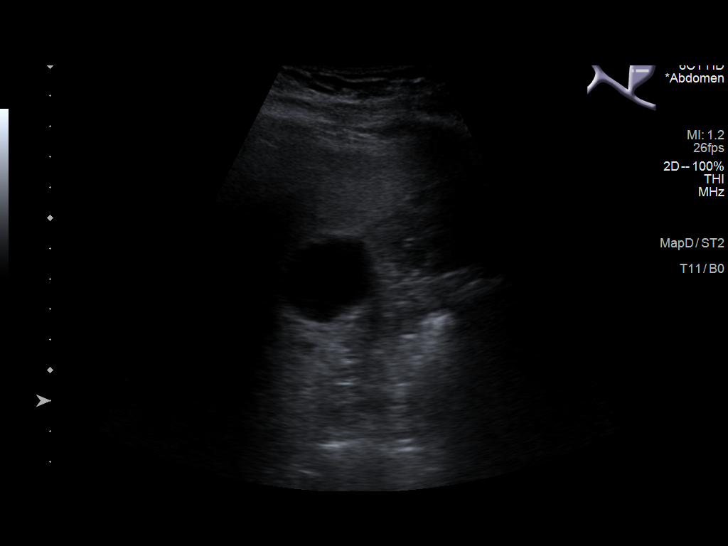
[im 19/76]
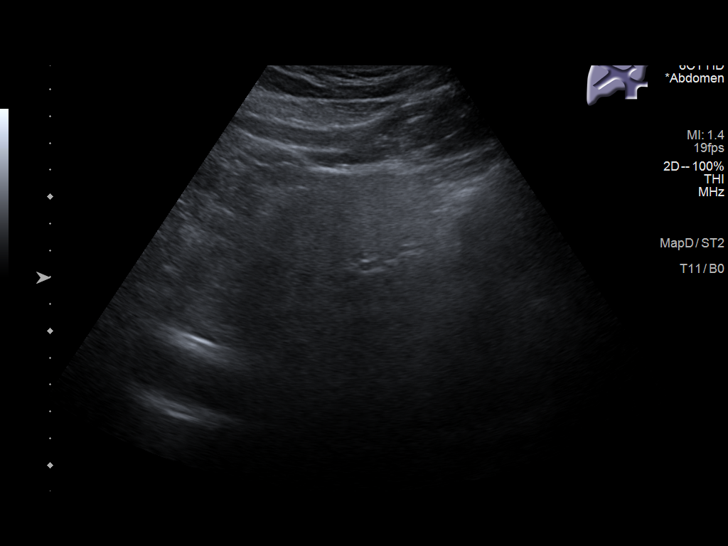
[im 26/76]
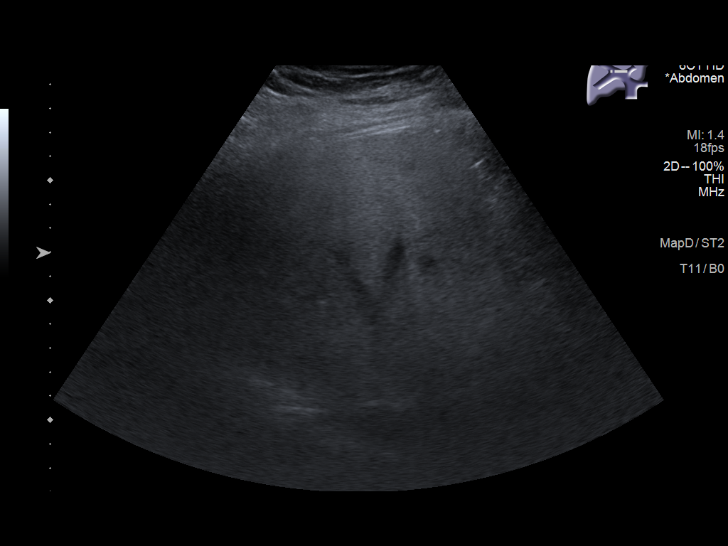
[im 29/76]
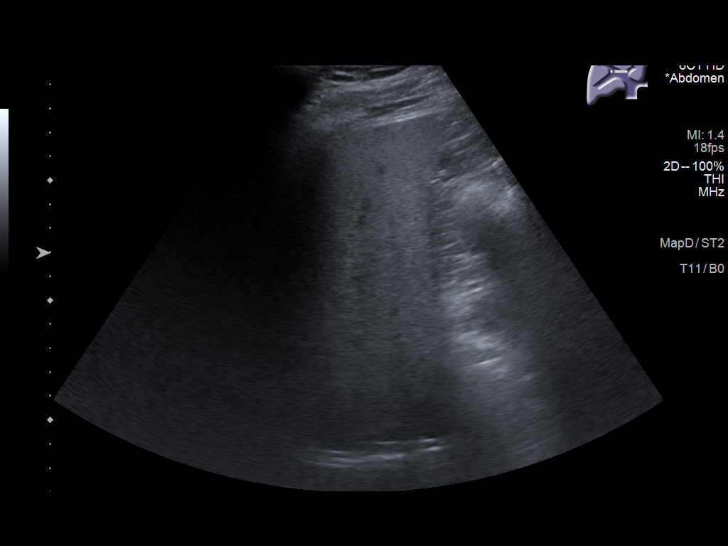
[im 35/76]
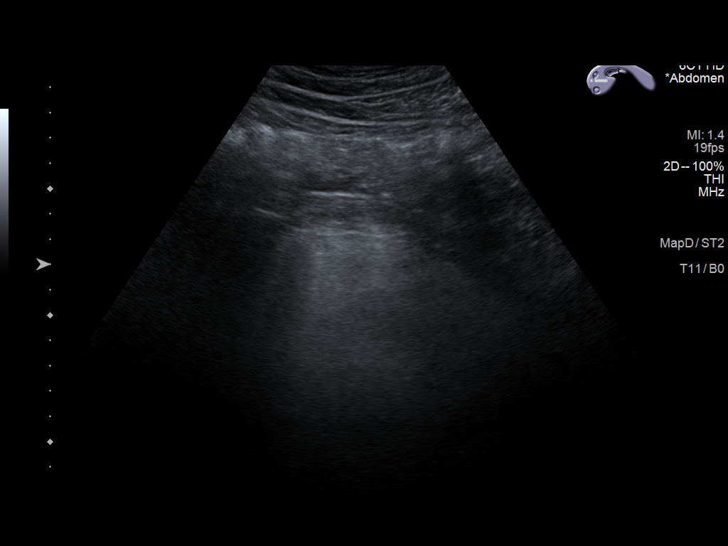
[im 41/76]
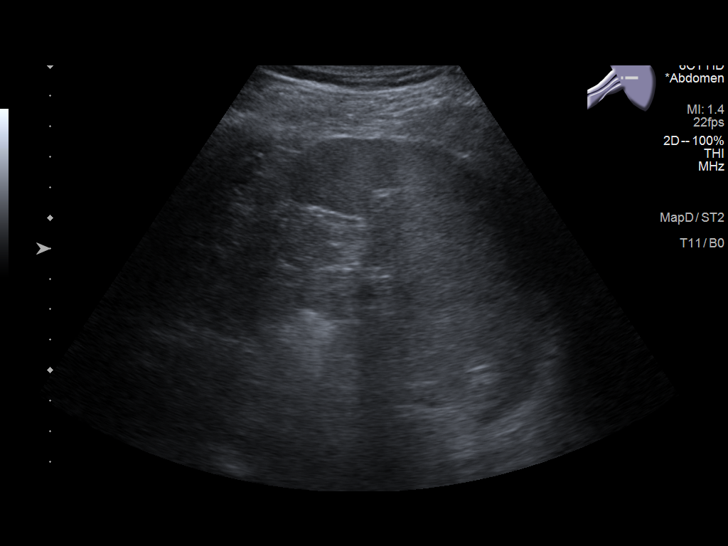
[im 47/76]
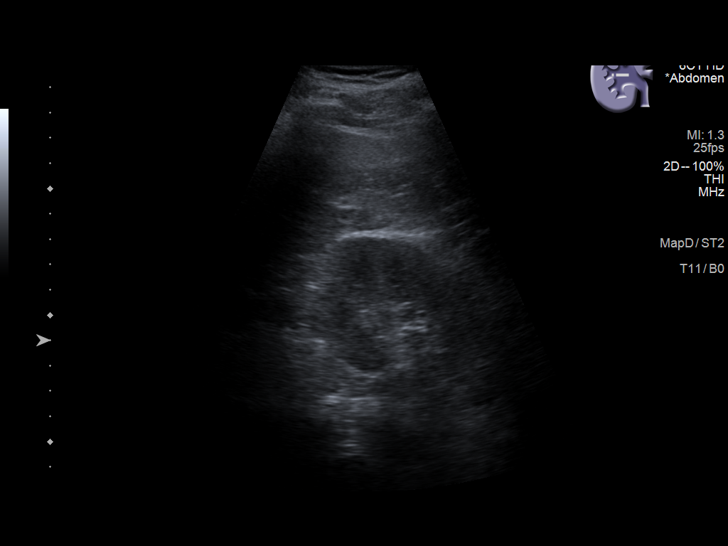
[im 51/76]
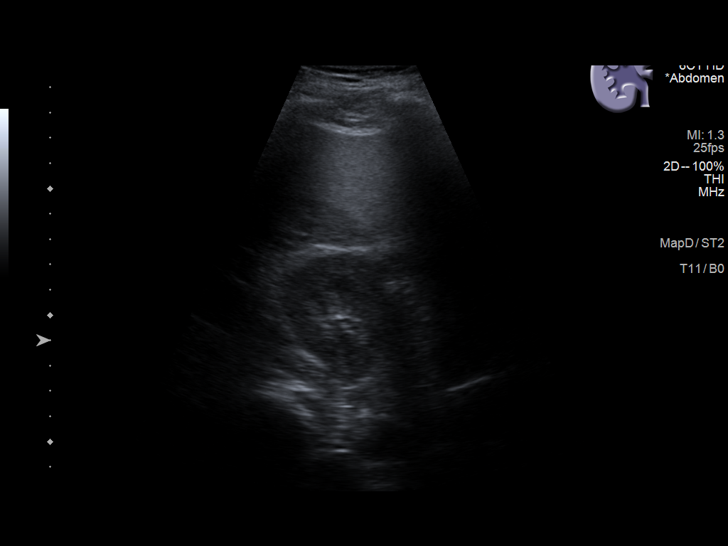
[im 57/76]
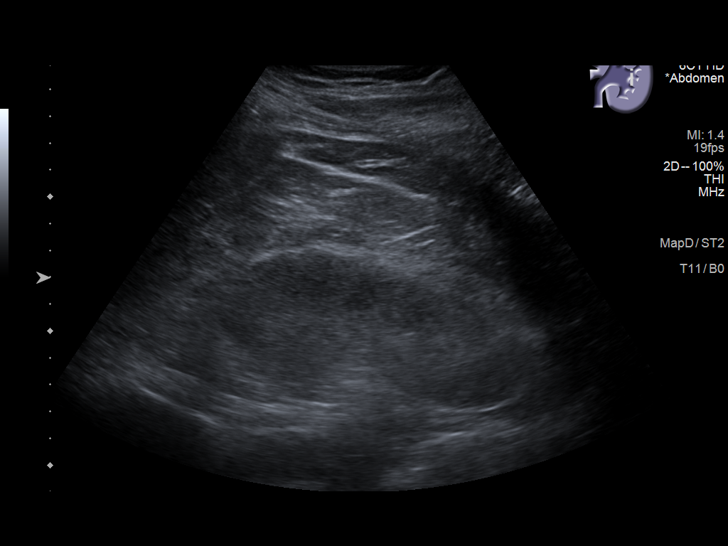
[im 63/76]
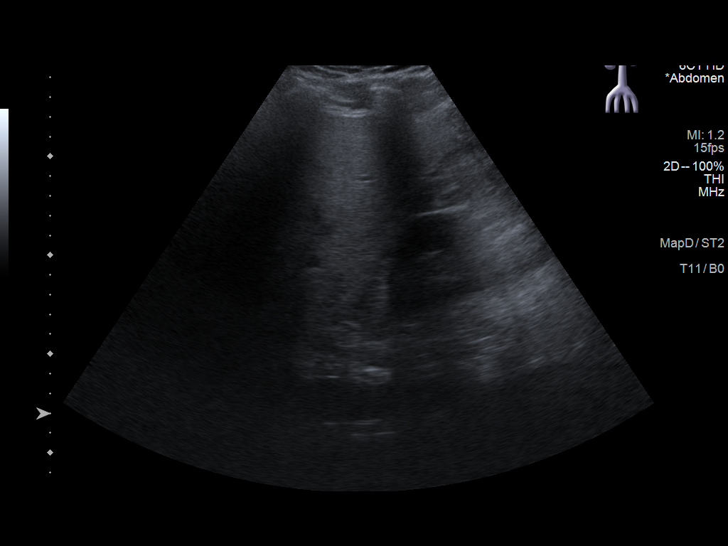
[im 69/76]
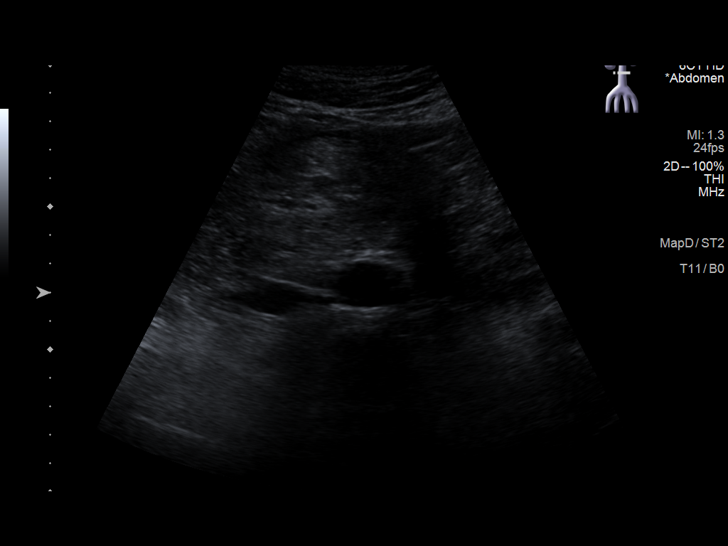
[im 76/76]
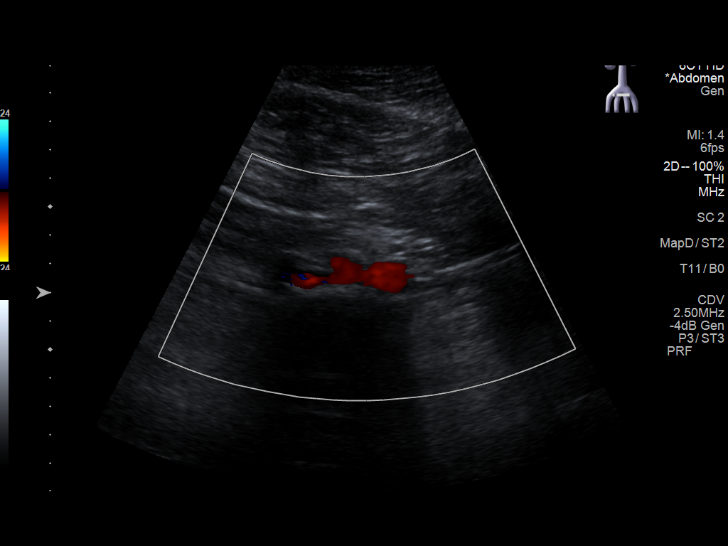

[14 of 25 positions shown; findings below may reference images not displayed]

FINDINGS: Gallbladder: No gallstones or wall thickening visualized. No
sonographic Murphy sign noted by sonographer.

Common bile duct: Diameter: 4 mm

Liver: No focal lesion identified. Increased parenchymal
echogenicity. Portal vein is patent on color Doppler imaging with
normal direction of blood flow towards the liver.

IVC: No abnormality visualized.

Pancreas: Not well visualized.

Spleen: Size and appearance within normal limits.

Right Kidney: Length: 12.0 cm. Echogenicity within normal limits. No
mass or hydronephrosis visualized.

Left Kidney: Length: 12.0 cm. Echogenicity within normal limits. No
mass or hydronephrosis visualized.

Abdominal aorta: No aneurysm visualized.

Other findings: None.
IMPRESSION: Increased echogenicity of the liver is consistent with hepatic
steatosis.
